# Patient Record
Sex: Female | Born: 1980
Health system: Southern US, Community
[De-identification: ages and names within clinical notes are randomized; demographics above are authoritative.]

## PROBLEM LIST (undated history)

## (undated) HISTORY — PX: TUBAL LIGATION: SHX77

## (undated) HISTORY — PX: ABDOMINAL HYSTERECTOMY: SHX81

## (undated) HISTORY — PX: OVARIAN CYST REMOVAL: SHX89

## (undated) HISTORY — PX: OTHER SURGICAL HISTORY: SHX169

---

## 2016-06-24 ENCOUNTER — Encounter (HOSPITAL_BASED_OUTPATIENT_CLINIC_OR_DEPARTMENT_OTHER): Payer: Self-pay | Admitting: Emergency Medicine

## 2016-06-24 ENCOUNTER — Emergency Department (HOSPITAL_BASED_OUTPATIENT_CLINIC_OR_DEPARTMENT_OTHER)
Admission: EM | Admit: 2016-06-24 | Discharge: 2016-06-24 | Disposition: A | Discharge status: Home / Self Care | Payer: Medicaid Other | Attending: Emergency Medicine | Admitting: Emergency Medicine

## 2016-06-24 DIAGNOSIS — Z4801 Encounter for change or removal of surgical wound dressing: Secondary | ICD-10-CM | POA: Insufficient documentation

## 2016-06-24 DIAGNOSIS — F17200 Nicotine dependence, unspecified, uncomplicated: Secondary | ICD-10-CM | POA: Diagnosis not present

## 2016-06-24 DIAGNOSIS — Z4889 Encounter for other specified surgical aftercare: Secondary | ICD-10-CM

## 2016-06-24 MED ORDER — ACETAMINOPHEN 325 MG PO TABS
650.0000 mg | ORAL_TABLET | Freq: Once | ORAL | Status: AC
  Administered 2016-06-24: 650 mg via ORAL
  Filled 2016-06-24: qty 2

## 2016-06-24 NOTE — ED Provider Notes (Signed)
[facility] Provider Note   CSN: [id number] Arrival date & time: 06/24/16  2144  By signing my name below, I, Linna Darner, attest that this documentation has been prepared under the direction and in the presence of [employer], PA-C. Electronically Signed: Linna Darner, Scribe. 06/24/2016. 10:58 PM.  History   Chief Complaint Chief Complaint  Patient presents with  . Wound Dehiscence    The history is provided by the patient. No language interpreter was used.    HPI Comments: Charlene Bryant is a 36 y.o. female not anticoagulated who presents to the Emergency Department complaining of significant bleeding from a wound site beginning tonight. She had a lesion removed from her left lower leg this morningand started bleeding from the incision site tonight. Pt endorses some pain to the wound site. She denies swelling to the area, numbness/tingling, weakness, fevers, chills, or any other associated symptoms.  History reviewed. No pertinent past medical history.  There are no active problems to display for this patient.   Past Surgical History:  Procedure Laterality Date  . ABDOMINAL HYSTERECTOMY    . OVARIAN CYST REMOVAL    . TUBAL LIGATION      OB History    No data available       Home Medications    Prior to Admission medications   Medication Sig Start Date End Date Taking? Authorizing Provider  BuPROPion HCl (WELLBUTRIN PO) Take by mouth.   Yes Historical Provider, MD  phentermine 37.5 MG capsule Take 37.5 mg by mouth every morning.   Yes Historical Provider, MD  varenicline (CHANTIX) 1 MG tablet Take 1 mg by mouth 2 (two) times daily.   Yes Historical Provider, MD    Family History No family history on file.  Social History Social History  Substance Use Topics  . Smoking status: Current Every Day Smoker  . Smokeless tobacco: Never Used  . Alcohol use No     Allergies   Amoxicillin   Review of Systems Review of Systems  Constitutional:  Negative for chills and fever.  Musculoskeletal: Positive for myalgias.  Skin: Positive for wound.  Neurological: Negative for weakness and numbness.    Physical Exam Updated Vital Signs BP 128/68 (BP Location: Left Arm)   Pulse 66   Temp 98.6 F (37 C) (Oral)   Resp 16   Ht 5\' 9"  (1.753 m)   Wt 184 lb (83.5 kg)   SpO2 97%   BMI 27.17 kg/m   Physical Exam  Constitutional: She appears well-developed and well-nourished. No distress.  HENT:  Head: Normocephalic and atraumatic.  Eyes: Conjunctivae and EOM are normal.  Neck: Neck supple. No tracheal deviation present.  Cardiovascular: Normal rate.   Pulmonary/Chest: Effort normal. No respiratory distress.  Musculoskeletal: Normal range of motion.  Neurological: She is alert.  Patient with 3 cm laceration with intact sutures to the left lower leg. There is some dried blood without active bleeding noted. No evidence of underlying hematoma. Area is generally minimally tender as expected.  Skin: Skin is warm and dry.  Psychiatric: She has a normal mood and affect. Her behavior is normal.  Nursing note and vitals reviewed.   ED Treatments / Results   Procedures Procedures (including critical care time)  DIAGNOSTIC STUDIES: Oxygen Saturation is 97% on RA, normal by my interpretation.    COORDINATION OF CARE: 11:02 PM Discussed treatment plan with pt at bedside and pt agreed to plan.  Medications Ordered in ED Medications - No data to display  Initial Impression / Assessment and Plan / ED Course  I have reviewed the triage vital signs and the nursing notes.  Pertinent labs & imaging results that were available during my care of the patient were reviewed by me and considered in my medical decision making (see chart for details).     Patient seen and examined. Wound appears intact. Attempted to reassure patient. Will place a pressure bandage for patient to leave on for the next 12 hours. She is instructed to call her  primary care physician who did the procedure tomorrow if she continues to have slow oozing from the wound. Patient to return to the emergency department with any significant bleeding or worsening pain.   Vital signs reviewed and are as follows: BP 128/68 (BP Location: Left Arm)   Pulse 66   Temp 98.6 F (37 C) (Oral)   Resp 16   Ht 5\' 9"  (1.753 m)   Wt 83.5 kg   SpO2 97%   BMI 27.17 kg/m     Final Clinical Impressions(s) / ED Diagnoses   Final diagnoses:  Encounter for post surgical wound check   Patient presents with bleeding from wound after minor procedure performed today. Wound edges are well approximated. No expanding hematoma suspected. Patient is not on any anticoagulation. Lower extremity is neurovascularly intact. Patient reassured, treatment as above, follow-up as above.  New Prescriptions New Prescriptions   No medications on file   I personally performed the services described in this documentation, which was scribed in my presence. The recorded information has been reviewed and is accurate.      Renne CriglerJoshua Teondra Newburg, PA-C 06/24/16 [id number]    Shon Batonourtney F Horton, MD 06/25/16 [phone]

## 2016-06-24 NOTE — Discharge Instructions (Signed)
Please read and follow all provided instructions.  Your diagnoses today include:  1. Encounter for post surgical wound check     Tests performed today include:  Vital signs. See below for your results today.   Medications prescribed:   None  Take any prescribed medications only as directed.   Home care instructions:  Follow any educational materials and wound care instructions contained in this packet.   Keep affected area above the level of your heart when possible to minimize swelling. Wash area gently twice a day with warm soapy water. Do not apply alcohol or hydrogen peroxide. Cover the area if it draining or weeping.   Follow-up instructions:  Return instructions:  Return to the Emergency Department if you have:  Fever  Worsening pain  Worsening swelling of the wound  Pus draining from the wound  Redness of the skin that moves away from the wound, especially if it streaks away from the affected area   Any other emergent concerns  Your vital signs today were: BP 128/68 (BP Location: Left Arm)    Pulse 66    Temp 98.6 F (37 C) (Oral)    Resp 16    Ht 5\' 9"  (1.753 m)    Wt 83.5 kg    SpO2 97%    BMI 27.17 kg/m  If your blood pressure (BP) was elevated above 135/85 this visit, please have this repeated by your doctor within one month. --------------

## 2016-06-24 NOTE — ED Triage Notes (Signed)
Pt had lesion removed from leg earlier today and reports wound has opened up and is complaining of pain.

## 2017-01-05 ENCOUNTER — Encounter (HOSPITAL_BASED_OUTPATIENT_CLINIC_OR_DEPARTMENT_OTHER): Payer: Self-pay

## 2017-01-05 ENCOUNTER — Emergency Department (HOSPITAL_BASED_OUTPATIENT_CLINIC_OR_DEPARTMENT_OTHER): Payer: Medicaid Other

## 2017-01-05 ENCOUNTER — Emergency Department (HOSPITAL_BASED_OUTPATIENT_CLINIC_OR_DEPARTMENT_OTHER)
Admission: EM | Admit: 2017-01-05 | Discharge: 2017-01-05 | Disposition: A | Discharge status: Home / Self Care | Payer: Medicaid Other | Attending: Emergency Medicine | Admitting: Emergency Medicine

## 2017-01-05 DIAGNOSIS — Y999 Unspecified external cause status: Secondary | ICD-10-CM | POA: Insufficient documentation

## 2017-01-05 DIAGNOSIS — M25511 Pain in right shoulder: Secondary | ICD-10-CM | POA: Diagnosis not present

## 2017-01-05 DIAGNOSIS — S39012A Strain of muscle, fascia and tendon of lower back, initial encounter: Secondary | ICD-10-CM

## 2017-01-05 DIAGNOSIS — F17210 Nicotine dependence, cigarettes, uncomplicated: Secondary | ICD-10-CM | POA: Insufficient documentation

## 2017-01-05 DIAGNOSIS — S22000A Wedge compression fracture of unspecified thoracic vertebra, initial encounter for closed fracture: Secondary | ICD-10-CM

## 2017-01-05 DIAGNOSIS — Z79899 Other long term (current) drug therapy: Secondary | ICD-10-CM | POA: Insufficient documentation

## 2017-01-05 DIAGNOSIS — Y92410 Unspecified street and highway as the place of occurrence of the external cause: Secondary | ICD-10-CM | POA: Insufficient documentation

## 2017-01-05 DIAGNOSIS — Y9389 Activity, other specified: Secondary | ICD-10-CM | POA: Insufficient documentation

## 2017-01-05 DIAGNOSIS — S3992XA Unspecified injury of lower back, initial encounter: Secondary | ICD-10-CM | POA: Diagnosis present

## 2017-01-05 MED ORDER — HYDROCODONE-ACETAMINOPHEN 5-325 MG PO TABS: 1.0000 | ORAL_TABLET | Freq: Four times a day (QID) | ORAL | 0 refills | Status: DC | PRN

## 2017-01-05 MED ORDER — METHOCARBAMOL 500 MG PO TABS: 500.0000 mg | ORAL_TABLET | Freq: Three times a day (TID) | ORAL | 0 refills | Status: DC | PRN

## 2017-01-05 MED FILL — METHOCARBAMOL 500 MG TABLET: 500 | 3 days supply | Qty: 10 | Fill #0

## 2017-01-05 MED FILL — HYDROCODON-APAP 5-325: 5-325 | 2 days supply | Qty: 8 | Fill #0
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
## 2017-01-07 ENCOUNTER — Emergency Department (HOSPITAL_BASED_OUTPATIENT_CLINIC_OR_DEPARTMENT_OTHER)
Admission: EM | Admit: 2017-01-07 | Discharge: 2017-01-07 | Disposition: A | Discharge status: Home / Self Care | Payer: Medicaid Other | Attending: Emergency Medicine | Admitting: Emergency Medicine

## 2017-01-07 ENCOUNTER — Encounter (HOSPITAL_BASED_OUTPATIENT_CLINIC_OR_DEPARTMENT_OTHER): Payer: Self-pay | Admitting: *Deleted

## 2017-01-07 DIAGNOSIS — M545 Low back pain, unspecified: Secondary | ICD-10-CM

## 2017-01-07 DIAGNOSIS — Z79899 Other long term (current) drug therapy: Secondary | ICD-10-CM | POA: Diagnosis not present

## 2017-01-07 DIAGNOSIS — F17210 Nicotine dependence, cigarettes, uncomplicated: Secondary | ICD-10-CM | POA: Insufficient documentation

## 2017-01-07 MED ORDER — OXYCODONE-ACETAMINOPHEN 5-325 MG PO TABS: 1.0000 | ORAL_TABLET | Freq: Four times a day (QID) | ORAL | 0 refills | Status: DC | PRN

## 2017-01-07 MED ORDER — KETOROLAC TROMETHAMINE 30 MG/ML IJ SOLN
15.0000 mg | Freq: Once | INTRAMUSCULAR | Status: AC
  Administered 2017-01-07: 15 mg via INTRAMUSCULAR
  Filled 2017-01-07: qty 1

## 2017-01-07 MED FILL — OXYCODONE-ACETAMINOPHEN 5-3: 5-325 | 2 days supply | Qty: 15 | Fill #0

## 2017-01-07 NOTE — ED Notes (Signed)
appt made with Neurosurgery Oct 9 th at 1430 Dr Cheri Fowler office

## 2017-01-07 NOTE — ED Notes (Signed)
ED Provider at bedside. 

## 2017-01-07 NOTE — Discharge Instructions (Signed)
Please read instructions below. Talk with your PCP about any new medications.  You can take 1-2 tabs of oxycodone every 4-6 hours as needed for severe pain. Do not take Tylenol, drink alcohol, or drivable taking this medication. You can take Advil/ibuprofen with this medication if needed for added pain relief. Apply ice to your back for 20 minutes at a time. Return to ER if new numbness or tingling in your arms or legs, inability to urinate, inability to hold your bowels, or weakness in your extremities.

## 2017-01-07 NOTE — ED Notes (Signed)
Attempted to call to make pt appointment, office will open at 9 am

## 2017-01-07 NOTE — ED Triage Notes (Signed)
Pt c/o back pain , seen here 2 days ago, has not f/u with Neuro.
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
## 2017-03-14 ENCOUNTER — Other Ambulatory Visit: Payer: Self-pay

## 2017-03-14 ENCOUNTER — Encounter (HOSPITAL_COMMUNITY): Payer: Self-pay

## 2017-03-14 ENCOUNTER — Emergency Department (HOSPITAL_COMMUNITY)
Admission: EM | Admit: 2017-03-14 | Discharge: 2017-03-14 | Disposition: A | Discharge status: Home / Self Care | Payer: Medicaid Other | Attending: Emergency Medicine | Admitting: Emergency Medicine

## 2017-03-14 ENCOUNTER — Emergency Department (HOSPITAL_COMMUNITY): Payer: Medicaid Other

## 2017-03-14 DIAGNOSIS — F17210 Nicotine dependence, cigarettes, uncomplicated: Secondary | ICD-10-CM | POA: Diagnosis not present

## 2017-03-14 DIAGNOSIS — Z79899 Other long term (current) drug therapy: Secondary | ICD-10-CM | POA: Diagnosis not present

## 2017-03-14 DIAGNOSIS — R519 Headache, unspecified: Secondary | ICD-10-CM

## 2017-03-14 DIAGNOSIS — R51 Headache: Secondary | ICD-10-CM | POA: Diagnosis not present

## 2017-03-14 MED ORDER — ACETAMINOPHEN 500 MG PO TABS
1000.0000 mg | ORAL_TABLET | Freq: Once | ORAL | Status: AC
  Administered 2017-03-14: 1000 mg via ORAL
  Filled 2017-03-14: qty 2

## 2017-03-14 MED ORDER — KETOROLAC TROMETHAMINE 30 MG/ML IJ SOLN
30.0000 mg | Freq: Once | INTRAMUSCULAR | Status: AC
  Administered 2017-03-14: 30 mg via INTRAMUSCULAR
  Filled 2017-03-14: qty 1

## 2017-03-14 MED ORDER — PROCHLORPERAZINE MALEATE 5 MG PO TABS
5.0000 mg | ORAL_TABLET | Freq: Once | ORAL | Status: AC
  Administered 2017-03-14: 5 mg via ORAL
  Filled 2017-03-14: qty 1

## 2017-03-14 NOTE — Discharge Instructions (Signed)
Your head CT was negative. Take ibuprofen and Tylenol for head pain. Follow up with primary care doctor as needed.  Return to the ED via develop changes in vision, persistent nausea or vomiting, numbness or weakness to extremities.
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
## 2017-04-14 ENCOUNTER — Encounter (HOSPITAL_BASED_OUTPATIENT_CLINIC_OR_DEPARTMENT_OTHER): Payer: Self-pay

## 2017-04-14 ENCOUNTER — Emergency Department (HOSPITAL_BASED_OUTPATIENT_CLINIC_OR_DEPARTMENT_OTHER): Payer: Medicaid Other

## 2017-04-14 ENCOUNTER — Emergency Department (HOSPITAL_BASED_OUTPATIENT_CLINIC_OR_DEPARTMENT_OTHER)
Admission: EM | Admit: 2017-04-14 | Discharge: 2017-04-14 | Disposition: A | Discharge status: Home / Self Care | Payer: Medicaid Other | Attending: Emergency Medicine | Admitting: Emergency Medicine

## 2017-04-14 ENCOUNTER — Other Ambulatory Visit: Payer: Self-pay

## 2017-04-14 DIAGNOSIS — Z79899 Other long term (current) drug therapy: Secondary | ICD-10-CM | POA: Diagnosis not present

## 2017-04-14 DIAGNOSIS — Y9301 Activity, walking, marching and hiking: Secondary | ICD-10-CM | POA: Insufficient documentation

## 2017-04-14 DIAGNOSIS — Y999 Unspecified external cause status: Secondary | ICD-10-CM | POA: Diagnosis not present

## 2017-04-14 DIAGNOSIS — Y929 Unspecified place or not applicable: Secondary | ICD-10-CM | POA: Insufficient documentation

## 2017-04-14 DIAGNOSIS — S90121A Contusion of right lesser toe(s) without damage to nail, initial encounter: Secondary | ICD-10-CM | POA: Diagnosis not present

## 2017-04-14 DIAGNOSIS — W2203XA Walked into furniture, initial encounter: Secondary | ICD-10-CM | POA: Diagnosis not present

## 2017-04-14 DIAGNOSIS — F17210 Nicotine dependence, cigarettes, uncomplicated: Secondary | ICD-10-CM | POA: Insufficient documentation

## 2017-04-14 DIAGNOSIS — S99921A Unspecified injury of right foot, initial encounter: Secondary | ICD-10-CM | POA: Diagnosis present

## 2017-04-14 MED ORDER — IBUPROFEN 800 MG PO TABS: 800.0000 mg | ORAL_TABLET | Freq: Three times a day (TID) | ORAL | 0 refills | Status: AC | PRN

## 2017-04-14 NOTE — ED Triage Notes (Signed)
Pt reports she hit her right foot, pinky toe on her dresser last night and the pain is shooting up into her foot.

## 2017-04-14 NOTE — Discharge Instructions (Addendum)
You were evaluated in the emergency department after striking your right fourth and fifth toes on the dresser.  Your x-ray showed no obvious fractures.  You should take ibuprofen every 8 hours as needed for pain and continue ice 20 minutes on,  20 minutes off to the affected area.  Weightbearing as tolerated.

## 2017-04-14 NOTE — ED Provider Notes (Signed)
[facility] EMERGENCY DEPARTMENT Provider Note   CSN: [id number] Arrival date & time: 04/14/17  0746     History   Chief Complaint Chief Complaint  Patient presents with  . Toe Pain    right foot, pinky toe    HPI Charlene Bryant is a 37 y.o. female.  HPI  37 year old female with no significant past medical history complaining of acute right fourth and fifth toe pain after striking her foot on her dresser last night.  Pain is moderate severity throbbing in nature worse with ambulation and improved with rest.  There is no associated numbness tingling and patient denies any other injury.  She is tried no medication for any relief.  Pain does radiate up into her midfoot.  Denies any other illness or other medical complaints.  History reviewed. No pertinent past medical history.  There are no active problems to display for this patient.   Past Surgical History:  Procedure Laterality Date  . ABDOMINAL HYSTERECTOMY    . OVARIAN CYST REMOVAL    . TUBAL LIGATION      OB History    No data available       Home Medications    Prior to Admission medications   Medication Sig Start Date End Date Taking? Authorizing Provider  BuPROPion HCl (WELLBUTRIN PO) Take by mouth.    [provider]  HYDROcodone-acetaminophen (NORCO/VICODIN) 5-325 MG tablet Take 1-2 tablets by mouth every 6 (six) hours as needed. 01/05/17   Benjiman CorePickering, Nathan, MD  Loratadine (CLARITIN PO) Take by mouth.    [provider]  methocarbamol (ROBAXIN) 500 MG tablet Take 1 tablet (500 mg total) by mouth every 8 (eight) hours as needed for muscle spasms. 01/05/17   Benjiman CorePickering, Nathan, MD  oxyCODONE-acetaminophen (PERCOCET/ROXICET) 5-325 MG tablet Take 1-2 tablets by mouth every 6 (six) hours as needed for severe pain. 01/07/17   Robinson, [place] N, PA-C  phentermine 37.5 MG capsule Take 37.5 mg by mouth every morning.    [provider]  TRAZODONE HCL PO Take by mouth.    [provider]    Family History No family history on file.  Social History Social History   Tobacco Use  . Smoking status: Current Every Day Smoker    Types: Cigarettes  . Smokeless tobacco: Never Used  Substance Use Topics  . Alcohol use: No  . Drug use: No     Allergies   Amoxicillin   Review of Systems Review of Systems  Constitutional: Negative for fever.  HENT: Negative for sore throat.   Respiratory: Negative for shortness of breath.   Cardiovascular: Negative for chest pain.  Gastrointestinal: Negative for abdominal pain.  Genitourinary: Negative for dysuria.  Skin: Negative for rash.     Physical Exam Updated Vital Signs BP 110/62 (BP Location: Right Arm)   Pulse 80   Temp 97.9 F (36.6 C) (Oral)   Resp 16   Ht 5\' 8"  (1.727 m)   Wt 81.6 kg (180 lb)   SpO2 100%   BMI 27.37 kg/m   Physical Exam  Constitutional: She appears well-developed and well-nourished.  HENT:  Head: Normocephalic and atraumatic.  Eyes: Conjunctivae are normal.  Neck: Neck supple.  Cardiovascular: Normal rate.  Pulmonary/Chest: Effort normal.  Musculoskeletal:       Right knee: Normal.       Right ankle: Normal.       Right foot: There is tenderness (4th and 5th MT and digits). There is no swelling,  no deformity and no laceration.       Feet:  Neurological: She is alert.  Skin: Skin is warm and dry.  Nursing note and vitals reviewed.    ED Treatments / Results  Labs (all labs ordered are listed, but only abnormal results are displayed) Labs Reviewed - No data to display  EKG  EKG Interpretation None       Radiology Dg Foot Complete Right  Result Date: 04/14/2017 CLINICAL DATA:  Hit foot against solid object EXAM: RIGHT FOOT COMPLETE - 3+ VIEW COMPARISON:  None. FINDINGS: Frontal, oblique, and lateral views were obtained. There is no appreciable fracture or dislocation. The joint spaces appear normal. No erosive change. IMPRESSION: No fracture or dislocation.   No evident arthropathy. Electronically Signed   By: Bretta Bang III M.D.   On: 04/14/2017 08:14    Procedures Procedures (including critical care time)  Medications Ordered in ED Medications - No data to display   Initial Impression / Assessment and Plan / ED Course  I have reviewed the triage vital signs and the nursing notes.  Pertinent labs & imaging results that were available during my care of the patient were reviewed by me and considered in my medical decision making (see chart for details).     Patient with blunt trauma to her right foot last evening.  Exam is benign.  And there is no open wounds.  We are getting an x-ray to see if there is a fracture but I discussed with the patient that the management would be symptomatic relief with ice elevation and time.  Final Clinical Impressions(s) / ED Diagnoses   Final diagnoses:  Contusion of fifth toe of right foot, initial encounter    ED Discharge Orders    None       Terrilee Files, MD 04/14/17 1551

## 2018-12-06 ENCOUNTER — Ambulatory Visit: Payer: Medicaid Other | Attending: Urology | Admitting: Physical Therapy

## 2019-03-06 ENCOUNTER — Emergency Department (HOSPITAL_BASED_OUTPATIENT_CLINIC_OR_DEPARTMENT_OTHER)
Admission: EM | Admit: 2019-03-06 | Discharge: 2019-03-06 | Disposition: A | Discharge status: Home / Self Care | Payer: Medicaid Other | Attending: Emergency Medicine | Admitting: Emergency Medicine

## 2019-03-06 ENCOUNTER — Other Ambulatory Visit: Payer: Self-pay

## 2019-03-06 ENCOUNTER — Encounter (HOSPITAL_BASED_OUTPATIENT_CLINIC_OR_DEPARTMENT_OTHER): Payer: Self-pay

## 2019-03-06 DIAGNOSIS — Z79899 Other long term (current) drug therapy: Secondary | ICD-10-CM | POA: Insufficient documentation

## 2019-03-06 DIAGNOSIS — F17210 Nicotine dependence, cigarettes, uncomplicated: Secondary | ICD-10-CM | POA: Diagnosis not present

## 2019-03-06 DIAGNOSIS — Y999 Unspecified external cause status: Secondary | ICD-10-CM | POA: Insufficient documentation

## 2019-03-06 DIAGNOSIS — Y9289 Other specified places as the place of occurrence of the external cause: Secondary | ICD-10-CM | POA: Insufficient documentation

## 2019-03-06 DIAGNOSIS — X500XXA Overexertion from strenuous movement or load, initial encounter: Secondary | ICD-10-CM | POA: Insufficient documentation

## 2019-03-06 DIAGNOSIS — S39012A Strain of muscle, fascia and tendon of lower back, initial encounter: Secondary | ICD-10-CM | POA: Diagnosis not present

## 2019-03-06 DIAGNOSIS — Y9389 Activity, other specified: Secondary | ICD-10-CM | POA: Insufficient documentation

## 2019-03-06 DIAGNOSIS — M5489 Other dorsalgia: Secondary | ICD-10-CM | POA: Diagnosis present

## 2019-03-06 DIAGNOSIS — M62830 Muscle spasm of back: Secondary | ICD-10-CM | POA: Insufficient documentation

## 2019-03-06 MED ORDER — KETOROLAC TROMETHAMINE 30 MG/ML IJ SOLN
30.0000 mg | Freq: Once | INTRAMUSCULAR | Status: AC
  Administered 2019-03-06: 30 mg via INTRAMUSCULAR
  Filled 2019-03-06: qty 1

## 2019-03-06 MED ORDER — NAPROXEN 500 MG PO TABS: 500.0000 mg | ORAL_TABLET | Freq: Two times a day (BID) | ORAL | 0 refills | Status: AC

## 2019-03-06 MED ORDER — METHOCARBAMOL 750 MG PO TABS
750.0000 mg | ORAL_TABLET | Freq: Every evening | ORAL | 0 refills | Status: AC | PRN
Start: 2019-03-06 — End: 2019-03-11

## 2019-03-06 NOTE — ED Provider Notes (Signed)
[place] EMERGENCY DEPARTMENT Provider Note   CSN: [id number] Arrival date & time: 03/06/19  1437     History   Chief Complaint Chief Complaint  Patient presents with  . Back Pain    HPI Charlene Bryant is a 38 y.o. female.     HPI   Pt is a 38 y/o female who presents to the ED today for eval of back pain. Pain started yesterday around 4-5 pm. Pain located to the bilat lower back. Pain is constant and has worsened since onset. Pain rated 10/10. She has tried bayer otc without relief. States pain feels like a pulled muscle and is worse when she moves. She states pain started after moving a lot of heavy objects yesterday. Denies GI/GU complaints.   Pt denies any numbness/tingling/weakness to the BLE. Denies saddle anesthesia. Denies loss of control of bowels or bladder. No urinary retention. No fevers. Denies a h/o IVDU. Denies a h/o CA.Marland Kitchen  No past medical history on file.  There are no active problems to display for this patient.   Past Surgical History:  Procedure Laterality Date  . ABDOMINAL HYSTERECTOMY    . OVARIAN CYST REMOVAL    . TUBAL LIGATION       OB History   No obstetric history on file.      Home Medications    Prior to Admission medications   Medication Sig Start Date End Date Taking? Authorizing Provider  BuPROPion HCl (WELLBUTRIN PO) Take by mouth.    [provider]  HYDROcodone-acetaminophen (NORCO/VICODIN) 5-325 MG tablet Take 1-2 tablets by mouth every 6 (six) hours as needed. 01/05/17   Davonna Belling, MD  ibuprofen (ADVIL,MOTRIN) 800 MG tablet Take 1 tablet (800 mg total) by mouth every 8 (eight) hours as needed. 04/14/17   Hayden Rasmussen, MD  Loratadine (CLARITIN PO) Take by mouth.    [provider]  methocarbamol (ROBAXIN) 750 MG tablet Take 1 tablet (750 mg total) by mouth at bedtime as needed for up to 5 days for muscle spasms. 03/06/19 03/11/19  Qasim Diveley S, PA-C  naproxen (NAPROSYN) 500 MG tablet  Take 1 tablet (500 mg total) by mouth 2 (two) times daily for 5 days. 03/06/19 03/11/19  Anothony Bursch S, PA-C  oxyCODONE-acetaminophen (PERCOCET/ROXICET) 5-325 MG tablet Take 1-2 tablets by mouth every 6 (six) hours as needed for severe pain. 01/07/17   Robinson, [place] N, PA-C  phentermine 37.5 MG capsule Take 37.5 mg by mouth every morning.    [provider]  ranitidine (ZANTAC) 75 MG tablet Take 75 mg by mouth 2 (two) times daily.    [provider]  TRAZODONE HCL PO Take by mouth.    [provider]    Family History No family history on file.  Social History Social History   Tobacco Use  . Smoking status: Current Every Day Smoker    Types: Cigarettes  . Smokeless tobacco: Never Used  Substance Use Topics  . Alcohol use: No  . Drug use: No     Allergies   Amoxicillin   Review of Systems Review of Systems  Constitutional: Negative for fever.  Gastrointestinal: Negative for abdominal pain, constipation, diarrhea, nausea and vomiting.  Genitourinary: Negative for dysuria, flank pain, frequency, hematuria, pelvic pain and urgency.  Musculoskeletal: Positive for back pain.  Neurological: Negative for weakness and numbness.     Physical Exam Updated Vital Signs BP 114/68   Pulse 62   Temp 99 F (37.2 C) (Oral)  Resp 17   Ht 5\' 9"  (1.753 m)   SpO2 99%   BMI 26.58 kg/m   Physical Exam Vitals signs and nursing note reviewed.  Constitutional:      General: She is not in acute distress.    Appearance: She is well-developed.  HENT:     Head: Normocephalic and atraumatic.  Eyes:     Conjunctiva/sclera: Conjunctivae normal.  Neck:     Musculoskeletal: Neck supple.  Cardiovascular:     Rate and Rhythm: Normal rate.  Pulmonary:     Effort: Pulmonary effort is normal.  Musculoskeletal: Normal range of motion.     Comments: TTP to the bilat lumbar paraspinous muscles with muscle spasm. 5/5 strength to ble with normal rom and sensation.  Ambulatory with steady gait.   Skin:    General: Skin is warm and dry.  Neurological:     Mental Status: She is alert.      ED Treatments / Results  Labs (all labs ordered are listed, but only abnormal results are displayed) Labs Reviewed - No data to display  EKG None  Radiology No results found.  Procedures Procedures (including critical care time)  Medications Ordered in ED Medications  ketorolac (TORADOL) 30 MG/ML injection 30 mg (has no administration in time range)     Initial Impression / Assessment and Plan / ED Course  I have reviewed the triage vital signs and the nursing notes.  Pertinent labs & imaging results that were available during my care of the patient were reviewed by me and considered in my medical decision making (see chart for details).  Final Clinical Impressions(s) / ED Diagnoses   Final diagnoses:  Strain of lumbar region, initial encounter   Patient with back pain.  Like muscle strain from heavy lifting yesterday. No neurological deficits and normal neuro exam.  Patient can walk but states is painful.  No loss of bowel or bladder control.  No concern for cauda equina.  No fever, night sweats, weight loss, h/o cancer, IVDU.  RICE protocol and pain medicine indicated and discussed with patient.   ED Discharge Orders         Ordered    naproxen (NAPROSYN) 500 MG tablet  2 times daily     03/06/19 1523    methocarbamol (ROBAXIN) 750 MG tablet  At bedtime PRN     03/06/19 1523           Annisha Baar S, PA-C 03/06/19 1525    03/08/19, MD 03/06/19 [phone]

## 2019-03-06 NOTE — Discharge Instructions (Addendum)
You may alternate taking Tylenol and Naproxen as needed for pain control. You may take Naproxen twice daily as directed on your discharge paperwork and you may take  [phone] mg of Tylenol every 6 hours. Do not exceed 4000 mg of Tylenol daily as this can lead to liver damage. Also, make sure to take Naproxen with meals as it can cause an upset stomach. Do not take other NSAIDs while taking Naproxen such as (Aleve, Ibuprofen, Aspirin, Celebrex, etc) and do not take more than the prescribed dose as this can lead to ulcers and bleeding in your GI tract. You may use warm and cold compresses to help with your symptoms.   You were given a prescription for Robaxin which is a muscle relaxer.  You should not drive, work, or operate machinery while taking this medication as it can make you very drowsy.    Please follow up with your primary doctor within the next 7-10 days for re-evaluation and further treatment of your symptoms.   Return to the emergency department immediately if you experience any back pain associated with fevers, loss of control of your bowels/bladder, weakness/numbness to your legs, numbness to your groin area, inability to walk, or inability to urinate.

## 2019-03-06 NOTE — ED Triage Notes (Signed)
Pt reports pulling a muscle in her lower back yesterday- pain worse today. Pt ambulatory with steady gait. Denies urinary symptoms.

## 2019-06-20 ENCOUNTER — Emergency Department (HOSPITAL_BASED_OUTPATIENT_CLINIC_OR_DEPARTMENT_OTHER)
Admission: EM | Admit: 2019-06-20 | Discharge: 2019-06-21 | Disposition: A | Discharge status: Home / Self Care | Payer: No Typology Code available for payment source | Attending: Emergency Medicine | Admitting: Emergency Medicine

## 2019-06-20 ENCOUNTER — Emergency Department (HOSPITAL_BASED_OUTPATIENT_CLINIC_OR_DEPARTMENT_OTHER): Payer: No Typology Code available for payment source

## 2019-06-20 ENCOUNTER — Other Ambulatory Visit: Payer: Self-pay

## 2019-06-20 ENCOUNTER — Encounter (HOSPITAL_BASED_OUTPATIENT_CLINIC_OR_DEPARTMENT_OTHER): Payer: Self-pay | Admitting: *Deleted

## 2019-06-20 DIAGNOSIS — Z880 Allergy status to penicillin: Secondary | ICD-10-CM | POA: Diagnosis not present

## 2019-06-20 DIAGNOSIS — R079 Chest pain, unspecified: Secondary | ICD-10-CM

## 2019-06-20 DIAGNOSIS — Z79899 Other long term (current) drug therapy: Secondary | ICD-10-CM | POA: Diagnosis not present

## 2019-06-20 DIAGNOSIS — F17210 Nicotine dependence, cigarettes, uncomplicated: Secondary | ICD-10-CM | POA: Insufficient documentation

## 2019-06-20 DIAGNOSIS — R0789 Other chest pain: Secondary | ICD-10-CM | POA: Insufficient documentation

## 2019-06-20 LAB — CBC WITH DIFFERENTIAL/PLATELET
Abs Immature Granulocytes: 0.01 10*3/uL (ref 0.00–0.07)
Basophils Absolute: 0.1 10*3/uL (ref 0.0–0.1)
Basophils Relative: 1 %
Eosinophils Absolute: 0.1 10*3/uL (ref 0.0–0.5)
Eosinophils Relative: 3 %
HCT: 38.8 % (ref 36.0–46.0)
Hemoglobin: 13.2 g/dL (ref 12.0–15.0)
Immature Granulocytes: 0 %
Lymphocytes Relative: 56 %
Lymphs Abs: 3.2 10*3/uL (ref 0.7–4.0)
MCH: 31.9 pg (ref 26.0–34.0)
MCHC: 34 g/dL (ref 30.0–36.0)
MCV: 93.7 fL (ref 80.0–100.0)
Monocytes Absolute: 0.4 10*3/uL (ref 0.1–1.0)
Monocytes Relative: 6 %
Neutro Abs: 2 10*3/uL (ref 1.7–7.7)
Neutrophils Relative %: 34 %
Platelets: 176 10*3/uL (ref 150–400)
RBC: 4.14 MIL/uL (ref 3.87–5.11)
RDW: 11.8 % (ref 11.5–15.5)
WBC: 5.7 10*3/uL (ref 4.0–10.5)
nRBC: 0 % (ref 0.0–0.2)

## 2019-06-20 LAB — HEPATIC FUNCTION PANEL
ALT: 15 U/L (ref 0–44)
AST: 17 U/L (ref 15–41)
Albumin: 3.9 g/dL (ref 3.5–5.0)
Alkaline Phosphatase: 50 U/L (ref 38–126)
Bilirubin, Direct: 0.1 mg/dL (ref 0.0–0.2)
Indirect Bilirubin: 0.3 mg/dL (ref 0.3–0.9)
Total Bilirubin: 0.4 mg/dL (ref 0.3–1.2)
Total Protein: 6.8 g/dL (ref 6.5–8.1)

## 2019-06-20 LAB — BASIC METABOLIC PANEL
Anion gap: 7 (ref 5–15)
BUN: 22 mg/dL — ABNORMAL HIGH (ref 6–20)
CO2: 25 mmol/L (ref 22–32)
Calcium: 9.4 mg/dL (ref 8.9–10.3)
Chloride: 106 mmol/L (ref 98–111)
Creatinine, Ser: 0.82 mg/dL (ref 0.44–1.00)
GFR calc Af Amer: >60 mL/min (ref >60)
GFR calc non Af Amer: >60 mL/min (ref >60)
Glucose, Bld: 90 mg/dL (ref 70–99)
Potassium: 3.6 mmol/L (ref 3.5–5.1)
Sodium: 138 mmol/L (ref 135–145)

## 2019-06-20 LAB — TROPONIN I (HIGH SENSITIVITY): Troponin I (High Sensitivity): <2 ng/L (ref <18)

## 2019-06-20 LAB — D-DIMER, QUANTITATIVE (NOT AT ARMC): D-Dimer, Quant: 0.31 ug/mL-FEU (ref 0.00–0.50)

## 2019-06-20 MED ORDER — CYCLOBENZAPRINE HCL 10 MG PO TABS
10.0000 mg | ORAL_TABLET | Freq: Once | ORAL | Status: AC
Start: 2019-06-20 — End: 2019-06-20
  Administered 2019-06-20: 23:00:00 10 mg via ORAL
  Filled 2019-06-20: qty 1

## 2019-06-20 NOTE — ED Provider Notes (Signed)
[facility] EMERGENCY DEPARTMENT Provider Note   CSN: [id number] Arrival date & time: 06/20/19  2212     History Chief Complaint  Patient presents with  . Chest Pain    Charlene Bryant is a 39 y.o. female.  The history is provided by the patient and medical records. No language interpreter was used.  Chest Pain Pain location:  R chest and R lateral chest Pain quality: crushing and pressure   Pain radiates to:  R shoulder Pain severity:  Severe Onset quality:  Sudden Duration:  3 hours Timing:  Constant Progression:  Unchanged Chronicity:  Recurrent Relieved by:  Nothing Worsened by:  Deep breathing and movement Ineffective treatments:  None tried Associated symptoms: shortness of breath   Associated symptoms: no abdominal pain, no back pain, no cough, no diaphoresis, no dizziness, no fatigue, no fever, no lower extremity edema, no nausea, no palpitations, no vomiting and no weakness   Risk factors: smoking   Risk factors: no aortic disease, no hypertension, not female and no prior DVT/PE        History reviewed. No pertinent past medical history.  There are no problems to display for this patient.   Past Surgical History:  Procedure Laterality Date  . ABDOMINAL HYSTERECTOMY    . OVARIAN CYST REMOVAL    . TUBAL LIGATION       OB History   No obstetric history on file.     History reviewed. No pertinent family history.  Social History   Tobacco Use  . Smoking status: Current Every Day Smoker    Types: Cigarettes  . Smokeless tobacco: Never Used  Substance Use Topics  . Alcohol use: No  . Drug use: No    Home Medications Prior to Admission medications   Medication Sig Start Date End Date Taking? Authorizing Provider  BuPROPion HCl (WELLBUTRIN PO) Take by mouth.    [provider]  HYDROcodone-acetaminophen (NORCO/VICODIN) 5-325 MG tablet Take 1-2 tablets by mouth every 6 (six) hours as needed. 01/05/17   Davonna Belling, MD    ibuprofen (ADVIL,MOTRIN) 800 MG tablet Take 1 tablet (800 mg total) by mouth every 8 (eight) hours as needed. 04/14/17   Hayden Rasmussen, MD  Loratadine (CLARITIN PO) Take by mouth.    [provider]  oxyCODONE-acetaminophen (PERCOCET/ROXICET) 5-325 MG tablet Take 1-2 tablets by mouth every 6 (six) hours as needed for severe pain. 01/07/17   Robinson, [place] N, PA-C  phentermine 37.5 MG capsule Take 37.5 mg by mouth every morning.    [provider]  ranitidine (ZANTAC) 75 MG tablet Take 75 mg by mouth 2 (two) times daily.    [provider]  TRAZODONE HCL PO Take by mouth.    [provider]    Allergies    Amoxicillin  Review of Systems   Review of Systems  Constitutional: Negative for chills, diaphoresis, fatigue and fever.  HENT: Negative for congestion.   Respiratory: Positive for shortness of breath. Negative for cough, choking, chest tightness and wheezing.   Cardiovascular: Positive for chest pain. Negative for palpitations and leg swelling.  Gastrointestinal: Negative for abdominal pain, constipation, diarrhea, nausea and vomiting.  Genitourinary: Negative for dysuria and flank pain.  Musculoskeletal: Negative for back pain, neck pain and neck stiffness.  Skin: Negative for rash and wound.  Neurological: Negative for dizziness, weakness and light-headedness.  Psychiatric/Behavioral: Negative for agitation.  All other systems reviewed and are negative.   Physical Exam Updated Vital Signs BP 108/64 (BP  Location: Left Arm)   Pulse 66   Temp 98.1 F (36.7 C) (Oral)   Resp 18   Ht 5\' 7"  (1.702 m)   Wt 89.4 kg   SpO2 99%   BMI 30.85 kg/m   Physical Exam Vitals and nursing note reviewed.  Constitutional:      General: She is not in acute distress.    Appearance: She is well-developed. She is not ill-appearing, toxic-appearing or diaphoretic.  HENT:     Head: Normocephalic and atraumatic.     Right Ear: External ear normal.      Left Ear: External ear normal.     Nose: Nose normal.     Mouth/Throat:     Pharynx: No oropharyngeal exudate.  Eyes:     Conjunctiva/sclera: Conjunctivae normal.     Pupils: Pupils are equal, round, and reactive to light.  Cardiovascular:     Rate and Rhythm: Normal rate and regular rhythm.     Heart sounds: Normal heart sounds.  Pulmonary:     Effort: Pulmonary effort is normal. No tachypnea or respiratory distress.     Breath sounds: Normal breath sounds. No stridor.  Chest:     Chest wall: Tenderness present.    Abdominal:     General: There is no distension.     Tenderness: There is no abdominal tenderness. There is no rebound.  Musculoskeletal:     Cervical back: Normal range of motion and neck supple.     Left lower leg: No tenderness. No edema.  Skin:    General: Skin is warm.     Capillary Refill: Capillary refill takes less than 2 seconds.     Findings: No erythema or rash.  Neurological:     General: No focal deficit present.     Mental Status: She is alert and oriented to person, place, and time.     Motor: No abnormal muscle tone.     Coordination: Coordination normal.     Deep Tendon Reflexes: Reflexes are normal and symmetric.  Psychiatric:        Mood and Affect: Mood normal.     ED Results / Procedures / Treatments   Labs (all labs ordered are listed, but only abnormal results are displayed) Labs Reviewed  BASIC METABOLIC PANEL - Abnormal; Notable for the following components:      Result Value   BUN 22 (*)    All other components within normal limits  CBC WITH DIFFERENTIAL/PLATELET  D-DIMER, QUANTITATIVE (NOT AT [facility])  HEPATIC FUNCTION PANEL  TROPONIN I (HIGH SENSITIVITY)  TROPONIN I (HIGH SENSITIVITY)    EKG EKG Interpretation  Date/Time:  Monday June 20 2019 22:15:29 EDT Ventricular Rate:  65 PR Interval:  164 QRS Duration: 90 QT Interval:  388 QTC Calculation: 403 R Axis:   66 Text Interpretation: Normal sinus rhythm Normal ECG  Confirmed by 07-14-2002 (Marianna Fuss) on 06/20/2019 10:31:23 PM   Radiology DG Chest 2 View  Result Date: 06/20/2019 CLINICAL DATA:  Chest pain EXAM: CHEST - 2 VIEW COMPARISON:  03/04/2011 FINDINGS: Mild bronchitic changes. No consolidation or pleural effusion. Cardiomediastinal silhouette within normal limits. No pneumothorax IMPRESSION: No active cardiopulmonary disease.  Bronchitic changes Electronically Signed   By: 03/06/2011 M.D.   On: 06/20/2019 22:40    Procedures Procedures (including critical care time)  Medications Ordered in ED Medications  cyclobenzaprine (FLEXERIL) tablet 10 mg (10 mg Oral Given 06/20/19 2314)    ED Course  I have reviewed the triage vital signs  and the nursing notes.  Pertinent labs & imaging results that were available during my care of the patient were reviewed by me and considered in my medical decision making (see chart for details).    MDM Rules/Calculators/A&P                      Charlene Bryant is a 39 y.o. female with no significant past medical history who presents with right-sided chest pain for the last 3 hours.  She reports that she had sudden onset of right-sided pleuritic chest discomfort that is worse with deep breathing.  She reports the pain radiates towards her right shoulder.  She reports she had this pain several years ago and had to stay in the hospital for it but does not member what they found.  She reports no significant nausea, vomiting, urinary symptoms or GI symptoms.  She does report some shortness of breath with it.  She denies history of DVT or PE but does report a family history of heart disease.  She does not know other significant family history.  She reports no significant fevers, chills, ingestion, or cough.  She denies trauma.  She does report some mild headache that is chronic.  She has not taken any medicine for her symptoms.  She describes a 10 out of 10.  On exam, lungs are clear and patient does have tenderness on  her right chest reproducing her discomfort.  Good pulses, strength, and sensation in upper extremities.  Abdomen nontender.  Legs nontender nonedematous.  Good pulses.  Patient alert and oriented.  EKG shows no STEMI.  Clinically I am most concerned about a musculoskeletal chest wall discomfort.  However, given the pleuritic nature of the symptoms and her report that it was sudden onset, we will get labs including a D-dimer and troponins.  Will get chest x-ray and monitor on telemetry.  As I am concerned most about muscle pains, will give muscle relaxant to see if this helps with discomfort while we work her up.  Chest x-ray returned unremarkable aside from bronchitic changes.  Anticipate discharge if work-up returns reassuring.  Patient's D-dimer was negative.  Initial troponin was negative.  CBC and CMP overall reassuring.    Patient was informed of these findings and how her symptoms are most likely musculoskeletal chest wall in nature.  Patient did not want to wait for second troponin and was feeling better after the Flexeril.  She would like to be discharged and can follow-up with her PCP.  She understood extremely strict return precautions.  She had no other questions or concerns and was discharged in good condition after reassuring work-up.    Final Clinical Impression(s) / ED Diagnoses Final diagnoses:  Nonspecific chest pain  Chest wall pain    Rx / DC Orders ED Discharge Orders         Ordered    cyclobenzaprine (FLEXERIL) 10 MG tablet  2 times daily PRN     06/21/19 0109          Clinical Impression: 1. Nonspecific chest pain   2. Chest wall pain     Disposition: Discharge  Condition: Good  I have discussed the results, Dx and Tx plan with the pt(& family if present). He/she/they expressed understanding and agree(s) with the plan. Discharge instructions discussed at great length. Strict return precautions discussed and pt &/or family have verbalized understanding  of the instructions. No further questions at time of discharge.    New Prescriptions  CYCLOBENZAPRINE (FLEXERIL) 10 MG TABLET    Take 1 tablet (10 mg total) by mouth 2 (two) times daily as needed for muscle spasms.    Follow Up: [facility] [address] [place] [address] [phone] Schedule an appointment as soon as possible for a visit    [facility] [place] [facility]GENCY DEPARTMENT [address] [id number] [id number] [place] [address] [phone]       Peregrine Nolt, Canary Brim, MD 06/21/19 [phone]

## 2019-06-20 NOTE — ED Triage Notes (Signed)
Right upper chest tenderness without radiation x 3 hours. Denies N/v/D. Reports increased pain with right arm movement, palpation and deep inspiration. No acute distress noted. Pt reports that she was at work when the pain started.

## 2019-06-21 MED ORDER — CYCLOBENZAPRINE HCL 10 MG PO TABS: 10.0000 mg | ORAL_TABLET | Freq: Two times a day (BID) | ORAL | 0 refills | Status: DC | PRN

## 2019-06-21 NOTE — Discharge Instructions (Signed)
Your history and exam today are consistent with musculoskeletal chest wall discomfort on the right chest. The other testing was reassuring including negative heart enzyme, negative blood clot test, and reassuring chest x-ray. Your vital signs were reassuring while we monitored you. Please rest and stay hydrated tomorrow and use the muscle relaxant to help with the discomfort. If any symptoms change or worsen, please return to the nearest emergency department.

## 2019-06-21 NOTE — ED Notes (Signed)
2nd troponin lab draw not needed per Tegeler MD.

## 2019-07-14 ENCOUNTER — Other Ambulatory Visit: Payer: Self-pay

## 2019-07-14 ENCOUNTER — Encounter (HOSPITAL_BASED_OUTPATIENT_CLINIC_OR_DEPARTMENT_OTHER): Payer: Self-pay | Admitting: *Deleted

## 2019-07-14 DIAGNOSIS — B349 Viral infection, unspecified: Secondary | ICD-10-CM | POA: Insufficient documentation

## 2019-07-14 DIAGNOSIS — F17210 Nicotine dependence, cigarettes, uncomplicated: Secondary | ICD-10-CM | POA: Insufficient documentation

## 2019-07-14 DIAGNOSIS — M2550 Pain in unspecified joint: Secondary | ICD-10-CM | POA: Insufficient documentation

## 2019-07-14 DIAGNOSIS — R0981 Nasal congestion: Secondary | ICD-10-CM | POA: Diagnosis present

## 2019-07-14 DIAGNOSIS — R05 Cough: Secondary | ICD-10-CM | POA: Insufficient documentation

## 2019-07-14 DIAGNOSIS — Z20822 Contact with and (suspected) exposure to covid-19: Secondary | ICD-10-CM | POA: Insufficient documentation

## 2019-07-14 DIAGNOSIS — J3489 Other specified disorders of nose and nasal sinuses: Secondary | ICD-10-CM | POA: Diagnosis not present

## 2019-07-14 DIAGNOSIS — Z79899 Other long term (current) drug therapy: Secondary | ICD-10-CM | POA: Insufficient documentation

## 2019-07-14 NOTE — ED Triage Notes (Signed)
C/o cough congestion and  fever  X 4 days, requesting covid test

## 2019-07-15 ENCOUNTER — Emergency Department (HOSPITAL_BASED_OUTPATIENT_CLINIC_OR_DEPARTMENT_OTHER)
Admission: EM | Admit: 2019-07-15 | Discharge: 2019-07-15 | Disposition: A | Discharge status: Home / Self Care | Payer: PRIVATE HEALTH INSURANCE | Attending: Emergency Medicine | Admitting: Emergency Medicine

## 2019-07-15 DIAGNOSIS — B349 Viral infection, unspecified: Secondary | ICD-10-CM

## 2019-07-15 LAB — SARS CORONAVIRUS 2 (TAT 6-24 HRS): SARS Coronavirus 2: NEGATIVE

## 2019-07-15 MED ORDER — DEXAMETHASONE 6 MG PO TABS
10.0000 mg | ORAL_TABLET | Freq: Once | ORAL | Status: AC
  Administered 2019-07-15: 02:00:00 10 mg via ORAL
  Filled 2019-07-15: qty 1

## 2019-07-15 NOTE — ED Provider Notes (Signed)
[facility] EMERGENCY DEPARTMENT Provider Note   CSN: [id number] Arrival date & time: 07/14/19  2316     History Chief Complaint  Patient presents with  . URI    Charlene Bryant is a 39 y.o. female.  The history is provided by the patient.  URI Presenting symptoms: congestion, cough, rhinorrhea and sore throat   Severity:  Mild Onset quality:  Gradual Duration:  4 days Timing:  Intermittent Progression:  Worsening Chronicity:  New Relieved by:  OTC medications Associated symptoms: arthralgias and sinus pain   Associated symptoms: no neck pain and no wheezing        History reviewed. No pertinent past medical history.  There are no problems to display for this patient.   Past Surgical History:  Procedure Laterality Date  . ABDOMINAL HYSTERECTOMY    . OVARIAN CYST REMOVAL    . TUBAL LIGATION       OB History   No obstetric history on file.     History reviewed. No pertinent family history.  Social History   Tobacco Use  . Smoking status: Current Every Day Smoker    Types: Cigarettes  . Smokeless tobacco: Never Used  Substance Use Topics  . Alcohol use: No  . Drug use: No    Home Medications Prior to Admission medications   Medication Sig Start Date End Date Taking? Authorizing Provider  BuPROPion HCl (WELLBUTRIN PO) Take by mouth.    [provider]  cyclobenzaprine (FLEXERIL) 10 MG tablet Take 1 tablet (10 mg total) by mouth 2 (two) times daily as needed for muscle spasms. 06/21/19   Tegeler, Gwenyth Allegra, MD  HYDROcodone-acetaminophen (NORCO/VICODIN) 5-325 MG tablet Take 1-2 tablets by mouth every 6 (six) hours as needed. 01/05/17   Davonna Belling, MD  ibuprofen (ADVIL,MOTRIN) 800 MG tablet Take 1 tablet (800 mg total) by mouth every 8 (eight) hours as needed. 04/14/17   Hayden Rasmussen, MD  Loratadine (CLARITIN PO) Take by mouth.    [provider]  oxyCODONE-acetaminophen (PERCOCET/ROXICET) 5-325 MG tablet Take 1-2  tablets by mouth every 6 (six) hours as needed for severe pain. 01/07/17   Robinson, [place] N, PA-C  phentermine 37.5 MG capsule Take 37.5 mg by mouth every morning.    [provider]  ranitidine (ZANTAC) 75 MG tablet Take 75 mg by mouth 2 (two) times daily.    [provider]  TRAZODONE HCL PO Take by mouth.    [provider]    Allergies    Amoxicillin  Review of Systems   Review of Systems  HENT: Positive for congestion, rhinorrhea, sinus pain and sore throat.   Respiratory: Positive for cough. Negative for wheezing.   Musculoskeletal: Positive for arthralgias. Negative for neck pain.  All other systems reviewed and are negative.   Physical Exam Updated Vital Signs BP 135/74   Pulse 73   Temp 98.1 F (36.7 C) (Oral)   Resp 18   Ht 5\' 7"  (1.702 m)   Wt 88.9 kg   SpO2 100%   BMI 30.70 kg/m   Physical Exam Vitals and nursing note reviewed.  Constitutional:      Appearance: She is well-developed.  HENT:     Head: Normocephalic and atraumatic.     Mouth/Throat:     Mouth: Mucous membranes are dry.     Pharynx: Oropharynx is clear.  Eyes:     Conjunctiva/sclera: Conjunctivae normal.     Pupils: Pupils are equal, round, and reactive to light.  Cardiovascular:     Rate and Rhythm: Normal rate and regular rhythm.     Heart sounds: No murmur.  Pulmonary:     Effort: No respiratory distress.     Breath sounds: No stridor.  Abdominal:     General: There is no distension.  Musculoskeletal:        General: No swelling, tenderness or deformity. Normal range of motion.     Cervical back: Normal range of motion.  Skin:    General: Skin is warm and dry.  Neurological:     General: No focal deficit present.     Mental Status: She is alert.     ED Results / Procedures / Treatments   Labs (all labs ordered are listed, but only abnormal results are displayed) Labs Reviewed  SARS CORONAVIRUS 2 (TAT 6-24 HRS)    EKG None  Radiology No  results found.  Procedures Procedures (including critical care time)  Medications Ordered in ED Medications  dexamethasone (DECADRON) tablet 10 mg (has no administration in time range)    ED Course  I have reviewed the triage vital signs and the nursing notes.  Pertinent labs & imaging results that were available during my care of the patient were reviewed by me and considered in my medical decision making (see chart for details).    MDM Rules/Calculators/A&P   Glenford Peers of some sort but essentially is here for a covid test. Ordered. Steroids for pharyngitis.  Final Clinical Impression(s) / ED Diagnoses Final diagnoses:  Viral syndrome    Rx / DC Orders ED Discharge Orders    None       Brandt Chaney, Barbara Cower, MD 07/15/19 [phone]

## 2019-10-12 ENCOUNTER — Other Ambulatory Visit: Payer: Self-pay

## 2019-10-12 ENCOUNTER — Encounter (HOSPITAL_BASED_OUTPATIENT_CLINIC_OR_DEPARTMENT_OTHER): Payer: Self-pay

## 2019-10-12 ENCOUNTER — Emergency Department (HOSPITAL_BASED_OUTPATIENT_CLINIC_OR_DEPARTMENT_OTHER)
Admission: EM | Admit: 2019-10-12 | Discharge: 2019-10-12 | Disposition: A | Discharge status: Home / Self Care | Payer: Medicaid Other | Attending: Emergency Medicine | Admitting: Emergency Medicine

## 2019-10-12 ENCOUNTER — Emergency Department (HOSPITAL_BASED_OUTPATIENT_CLINIC_OR_DEPARTMENT_OTHER): Payer: Medicaid Other

## 2019-10-12 DIAGNOSIS — Z20822 Contact with and (suspected) exposure to covid-19: Secondary | ICD-10-CM | POA: Insufficient documentation

## 2019-10-12 DIAGNOSIS — F17210 Nicotine dependence, cigarettes, uncomplicated: Secondary | ICD-10-CM | POA: Diagnosis not present

## 2019-10-12 DIAGNOSIS — J069 Acute upper respiratory infection, unspecified: Secondary | ICD-10-CM | POA: Diagnosis not present

## 2019-10-12 DIAGNOSIS — R05 Cough: Secondary | ICD-10-CM | POA: Diagnosis present

## 2019-10-12 DIAGNOSIS — R062 Wheezing: Secondary | ICD-10-CM

## 2019-10-12 DIAGNOSIS — R0602 Shortness of breath: Secondary | ICD-10-CM

## 2019-10-12 LAB — COMPREHENSIVE METABOLIC PANEL
ALT: 21 U/L (ref 0–44)
AST: 19 U/L (ref 15–41)
Albumin: 3.8 g/dL (ref 3.5–5.0)
Alkaline Phosphatase: 55 U/L (ref 38–126)
Anion gap: 10 (ref 5–15)
BUN: 11 mg/dL (ref 6–20)
CO2: 25 mmol/L (ref 22–32)
Calcium: 8.6 mg/dL — ABNORMAL LOW (ref 8.9–10.3)
Chloride: 105 mmol/L (ref 98–111)
Creatinine, Ser: 0.81 mg/dL (ref 0.44–1.00)
GFR calc Af Amer: >60 mL/min (ref >60)
GFR calc non Af Amer: >60 mL/min (ref >60)
Glucose, Bld: 91 mg/dL (ref 70–99)
Potassium: 3.8 mmol/L (ref 3.5–5.1)
Sodium: 140 mmol/L (ref 135–145)
Total Bilirubin: 0.4 mg/dL (ref 0.3–1.2)
Total Protein: 6.8 g/dL (ref 6.5–8.1)

## 2019-10-12 LAB — CBC WITH DIFFERENTIAL/PLATELET
Abs Immature Granulocytes: 0.01 10*3/uL (ref 0.00–0.07)
Basophils Absolute: 0.1 10*3/uL (ref 0.0–0.1)
Basophils Relative: 1 %
Eosinophils Absolute: 0.1 10*3/uL (ref 0.0–0.5)
Eosinophils Relative: 2 %
HCT: 37.3 % (ref 36.0–46.0)
Hemoglobin: 12.4 g/dL (ref 12.0–15.0)
Immature Granulocytes: 0 %
Lymphocytes Relative: 30 %
Lymphs Abs: 1.9 10*3/uL (ref 0.7–4.0)
MCH: 31.7 pg (ref 26.0–34.0)
MCHC: 33.2 g/dL (ref 30.0–36.0)
MCV: 95.4 fL (ref 80.0–100.0)
Monocytes Absolute: 0.6 10*3/uL (ref 0.1–1.0)
Monocytes Relative: 9 %
Neutro Abs: 3.8 10*3/uL (ref 1.7–7.7)
Neutrophils Relative %: 58 %
Platelets: 179 10*3/uL (ref 150–400)
RBC: 3.91 MIL/uL (ref 3.87–5.11)
RDW: 12.6 % (ref 11.5–15.5)
WBC: 6.5 10*3/uL (ref 4.0–10.5)
nRBC: 0 % (ref 0.0–0.2)

## 2019-10-12 LAB — SARS CORONAVIRUS 2 BY RT PCR (HOSPITAL ORDER, PERFORMED IN ~~LOC~~ HOSPITAL LAB): SARS Coronavirus 2: NEGATIVE

## 2019-10-12 MED ORDER — PREDNISONE 50 MG PO TABS: 50.0000 mg | ORAL_TABLET | Freq: Every day | ORAL | 0 refills | Status: AC

## 2019-10-12 MED ORDER — ALBUTEROL SULFATE HFA 108 (90 BASE) MCG/ACT IN AERS
2.0000 | INHALATION_SPRAY | Freq: Once | RESPIRATORY_TRACT | Status: AC
  Administered 2019-10-12: 2 via RESPIRATORY_TRACT
  Filled 2019-10-12: qty 6.7

## 2019-10-12 NOTE — ED Triage Notes (Signed)
Pt c/o flu like sx x 2 days-loss of smell today-NAD-steady gait

## 2019-10-12 NOTE — ED Provider Notes (Signed)
[facility] EMERGENCY DEPARTMENT Provider Note   CSN: [id number] Arrival date & time: 10/12/19  1943     History Chief Complaint  Patient presents with  . Cough    Charlene Bryant is a 39 y.o. female.  The history is provided by the patient and medical records. No language interpreter was used.  Cough Cough characteristics:  Productive Sputum characteristics:  Nondescript Severity:  Severe Onset quality:  Gradual Duration:  4 days Timing:  Constant Progression:  Unchanged Chronicity:  New Context: sick contacts and upper respiratory infection   Relieved by:  Nothing Worsened by:  Nothing Ineffective treatments:  None tried Associated symptoms: chills, myalgias, rhinorrhea, shortness of breath, sinus congestion and wheezing   Associated symptoms: no chest pain, no diaphoresis, no fever, no headaches, no rash and no weight loss        History reviewed. No pertinent past medical history.  There are no problems to display for this patient.   Past Surgical History:  Procedure Laterality Date  . ABDOMINAL HYSTERECTOMY    . OVARIAN CYST REMOVAL    . TUBAL LIGATION       OB History   No obstetric history on file.     No family history on file.  Social History   Tobacco Use  . Smoking status: Current Every Day Smoker    Types: Cigarettes  . Smokeless tobacco: Never Used  Vaping Use  . Vaping Use: Every day  Substance Use Topics  . Alcohol use: No  . Drug use: No    Home Medications Prior to Admission medications   Medication Sig Start Date End Date Taking? Authorizing Provider  ibuprofen (ADVIL,MOTRIN) 800 MG tablet Take 1 tablet (800 mg total) by mouth every 8 (eight) hours as needed. 04/14/17   Terrilee Files, MD  Loratadine (CLARITIN PO) Take by mouth.    [provider]  ranitidine (ZANTAC) 75 MG tablet Take 75 mg by mouth 2 (two) times daily.    [provider]    Allergies    Amoxicillin  Review of Systems     Review of Systems  Constitutional: Positive for chills and fatigue. Negative for diaphoresis, fever and weight loss.  HENT: Positive for congestion and rhinorrhea.   Eyes: Negative for pain, redness and visual disturbance.  Respiratory: Positive for cough, chest tightness, shortness of breath and wheezing.   Cardiovascular: Negative for chest pain, palpitations and leg swelling.  Gastrointestinal: Negative for abdominal pain, constipation, diarrhea, nausea and vomiting.  Genitourinary: Negative for dysuria, flank pain and frequency.  Musculoskeletal: Positive for myalgias. Negative for back pain, neck pain and neck stiffness.  Skin: Negative for rash and wound.  Neurological: Negative for weakness, light-headedness, numbness and headaches.  Psychiatric/Behavioral: Negative for agitation and confusion.  All other systems reviewed and are negative.   Physical Exam Updated Vital Signs BP 115/63 (BP Location: Right Arm)   Pulse 88   Temp 98.1 F (36.7 C) (Oral)   Resp 19   SpO2 100%   Physical Exam Vitals and nursing note reviewed.  Constitutional:      General: She is not in acute distress.    Appearance: She is well-developed. She is not ill-appearing, toxic-appearing or diaphoretic.  HENT:     Head: Normocephalic and atraumatic.     Nose: Congestion and rhinorrhea present.     Mouth/Throat:     Mouth: Mucous membranes are moist.     Pharynx: No oropharyngeal exudate or posterior oropharyngeal erythema.  Eyes:  Extraocular Movements: Extraocular movements intact.     Conjunctiva/sclera: Conjunctivae normal.     Pupils: Pupils are equal, round, and reactive to light.  Cardiovascular:     Rate and Rhythm: Normal rate and regular rhythm.     Pulses: Normal pulses.     Heart sounds: No murmur heard.   Pulmonary:     Effort: Pulmonary effort is normal. No respiratory distress.     Breath sounds: Normal breath sounds. No wheezing, rhonchi or rales.  Chest:     Chest wall:  No tenderness.  Abdominal:     General: Abdomen is flat.     Palpations: Abdomen is soft.     Tenderness: There is no abdominal tenderness. There is no right CVA tenderness, left CVA tenderness, guarding or rebound.  Musculoskeletal:        General: No tenderness.     Cervical back: Neck supple. No tenderness.     Right lower leg: No edema.     Left lower leg: No edema.  Skin:    General: Skin is warm and dry.     Capillary Refill: Capillary refill takes less than 2 seconds.     Findings: No erythema or rash.  Neurological:     General: No focal deficit present.     Mental Status: She is alert.  Psychiatric:        Mood and Affect: Mood normal.     ED Results / Procedures / Treatments   Labs (all labs ordered are listed, but only abnormal results are displayed) Labs Reviewed  COMPREHENSIVE METABOLIC PANEL - Abnormal; Notable for the following components:      Result Value   Calcium 8.6 (*)    All other components within normal limits  SARS CORONAVIRUS 2 BY RT PCR (HOSPITAL ORDER, PERFORMED IN [facility])  CBC WITH DIFFERENTIAL/PLATELET    EKG EKG Interpretation  Date/Time:  Wednesday October 12 2019 21:22:58 EDT Ventricular Rate:  85 PR Interval:    QRS Duration: 92 QT Interval:  350 QTC Calculation: 417 R Axis:   68 Text Interpretation: Sinus rhythm RSR' in V1 or V2, right VCD or RVH When compared to prior, no significant changes seen. No STEMI Confirmed by Theda Belfast ([id number]) on 10/12/2019 9:54:47 PM   Radiology DG Chest Portable 1 View  Result Date: 10/12/2019 CLINICAL DATA:  Cough, shortness of breath and fatigue EXAM: PORTABLE CHEST 1 VIEW COMPARISON:  Radiograph 06/20/2019 FINDINGS: Increased attenuation towards the lung bases likely secondary to patient body habitus/breast tissue. No consolidation, features of edema, pneumothorax, or effusion. The cardiomediastinal contours are unremarkable. No acute osseous or soft tissue abnormality. IMPRESSION:  No acute cardiopulmonary disease. Electronically Signed   By: Kreg Shropshire M.D.   On: 10/12/2019 21:26    Procedures Procedures (including critical care time)  Medications Ordered in ED Medications  albuterol (VENTOLIN HFA) 108 (90 Base) MCG/ACT inhaler 2 puff (2 puffs Inhalation Given 10/12/19 2109)    ED Course  I have reviewed the triage vital signs and the nursing notes.  Pertinent labs & imaging results that were available during my care of the patient were reviewed by me and considered in my medical decision making (see chart for details).    MDM Rules/Calculators/A&P                          Charlene Bryant is a 39 y.o. female with a past medical history significant for prior hysterectomy and  prior asthma who presents with several days of subjective chills, productive cough, shortness of breath, chest tightness, wheezing, URI symptoms, and loss of smell.  Patient reports that she has not had any Covid vaccination but has not had any known exposures however her daughter has been a summer camp and had URI symptoms over the last several days as well.  Patient has had chest tightness but will not describe true chest pain.  She is reporting shortness of breath with significant coughing with a phlegm-like sputum.  She denies URI symptoms and diffuse soreness and malaise.  She denies significant headache.  She denies any urinary symptoms or GI symptoms.  She is concerned she is dehydrated as well with decreased oral intake as she is feeling fatigued and feeling bad.  On exam, lungs have very faint wheeze but no rhonchi.  Chest and abdomen were nontender.  Back was nontender.  Good pulses in extremities.  No lower extremity tenderness or edema.  No reported history of DVT or PE.  Vital signs reassuring on arrival however patient is very concerned about her breathing and URI symptoms.  Due to the reported chest tightness, shortness of breath, and other URI symptoms in the setting of the ongoing  pandemic, we will get a Covid test sent.  We will also get a chest x-ray and labs as patient feels she is very dehydrated and fatigue.  If her labs are abnormal, will consider rehydration with IV fluids however anticipate she will be stable for discharge home after work-up with work note for several days for likely viral URI.  Covid test negative and labs reassuring.  No evidence of pneumonia.  Suspect viral URI causing her symptoms.  She was able to tolerate p.o. and to maintain hydration at home.  Feel she is safe for discharge home without getting IV fluids at this time given lack of AKI or other changes.  She felt much better after getting albuterol and I do suspect a mild wheezing exacerbation.  We will give her a burst of steroids for the next several days and use her albuterol.    Patient agreed with plan of care and was discharged in good condition with instructions to rest and stay hydrated.  She will follow-up with a PCP.  She understood return precautions and was discharged.   Final Clinical Impression(s) / ED Diagnoses Final diagnoses:  Viral URI with cough  Shortness of breath  Wheezing    Rx / DC Orders ED Discharge Orders         Ordered    predniSONE (DELTASONE) 50 MG tablet  Daily     Discontinue  Reprint     10/12/19 2259         Clinical Impression: 1. Viral URI with cough   2. Shortness of breath   3. Wheezing     Disposition: Discharge  Condition: Good  I have discussed the results, Dx and Tx plan with the pt(& family if present). He/she/they expressed understanding and agree(s) with the plan. Discharge instructions discussed at great length. Strict return precautions discussed and pt &/or family have verbalized understanding of the instructions. No further questions at time of discharge.    Discharge Medication List as of 10/12/2019 11:00 PM    START taking these medications   Details  predniSONE (DELTASONE) 50 MG tablet Take 1 tablet (50 mg total) by mouth  daily for 5 days., Starting Wed 10/12/2019, Until Mon 10/17/2019, Print        Follow  Up: [facility] [address]  [place] [id number] [phone] Schedule an appointment as soon as possible for a visit    [facility] [address] [id number] HQ [id number] [address] [place] [id number]       Sourish Allender, Canary Brimhristopher J, MD 10/13/19 [phone]

## 2019-10-12 NOTE — Discharge Instructions (Addendum)
Your work-up today was negative for Covid.  Your x-ray did not show pneumonia or other abnormality.  Your labs were reassuring.  I suspect a viral upper respiratory infection causing your symptoms that she likely got from your daughter.  Please rest and stay hydrated.  Please follow-up with your primary doctor.  If any symptoms change or worsen, please return to the nearest emergency department immediately.  Please use the albuterol and the steroids given the wheezing episodes you are having has this may be a mild asthma exacerbation.

## 2019-10-12 NOTE — ED Notes (Signed)
Crackers and water given.

## 2020-04-01 ENCOUNTER — Emergency Department (HOSPITAL_BASED_OUTPATIENT_CLINIC_OR_DEPARTMENT_OTHER): Payer: No Typology Code available for payment source

## 2020-04-01 ENCOUNTER — Encounter (HOSPITAL_BASED_OUTPATIENT_CLINIC_OR_DEPARTMENT_OTHER): Payer: Self-pay

## 2020-04-01 ENCOUNTER — Other Ambulatory Visit: Payer: Self-pay

## 2020-04-01 ENCOUNTER — Emergency Department (HOSPITAL_BASED_OUTPATIENT_CLINIC_OR_DEPARTMENT_OTHER)
Admission: EM | Admit: 2020-04-01 | Discharge: 2020-04-01 | Disposition: A | Discharge status: Home / Self Care | Payer: No Typology Code available for payment source | Attending: Emergency Medicine | Admitting: Emergency Medicine

## 2020-04-01 DIAGNOSIS — F17210 Nicotine dependence, cigarettes, uncomplicated: Secondary | ICD-10-CM | POA: Diagnosis not present

## 2020-04-01 DIAGNOSIS — R1084 Generalized abdominal pain: Secondary | ICD-10-CM | POA: Insufficient documentation

## 2020-04-01 DIAGNOSIS — Z90710 Acquired absence of both cervix and uterus: Secondary | ICD-10-CM | POA: Insufficient documentation

## 2020-04-01 DIAGNOSIS — R1010 Upper abdominal pain, unspecified: Secondary | ICD-10-CM | POA: Diagnosis present

## 2020-04-01 DIAGNOSIS — R1110 Vomiting, unspecified: Secondary | ICD-10-CM | POA: Insufficient documentation

## 2020-04-01 LAB — COMPREHENSIVE METABOLIC PANEL
ALT: 12 U/L (ref 0–44)
AST: 17 U/L (ref 15–41)
Albumin: 4.2 g/dL (ref 3.5–5.0)
Alkaline Phosphatase: 46 U/L (ref 38–126)
Anion gap: 8 (ref 5–15)
BUN: 15 mg/dL (ref 6–20)
CO2: 23 mmol/L (ref 22–32)
Calcium: 9 mg/dL (ref 8.9–10.3)
Chloride: 103 mmol/L (ref 98–111)
Creatinine, Ser: 0.62 mg/dL (ref 0.44–1.00)
GFR, Estimated: >60 mL/min (ref >60)
Glucose, Bld: 82 mg/dL (ref 70–99)
Potassium: 4 mmol/L (ref 3.5–5.1)
Sodium: 134 mmol/L — ABNORMAL LOW (ref 135–145)
Total Bilirubin: 0.5 mg/dL (ref 0.3–1.2)
Total Protein: 6.9 g/dL (ref 6.5–8.1)

## 2020-04-01 LAB — CBC
HCT: 39.5 % (ref 36.0–46.0)
Hemoglobin: 13.6 g/dL (ref 12.0–15.0)
MCH: 31.6 pg (ref 26.0–34.0)
MCHC: 34.4 g/dL (ref 30.0–36.0)
MCV: 91.9 fL (ref 80.0–100.0)
Platelets: 196 10*3/uL (ref 150–400)
RBC: 4.3 MIL/uL (ref 3.87–5.11)
RDW: 12 % (ref 11.5–15.5)
WBC: 4.3 10*3/uL (ref 4.0–10.5)
nRBC: 0 % (ref 0.0–0.2)

## 2020-04-01 LAB — URINALYSIS, ROUTINE W REFLEX MICROSCOPIC
Bilirubin Urine: NEGATIVE
Glucose, UA: NEGATIVE mg/dL
Ketones, ur: NEGATIVE mg/dL
Leukocytes,Ua: NEGATIVE
Nitrite: NEGATIVE
Protein, ur: NEGATIVE mg/dL
Specific Gravity, Urine: 1.02 (ref 1.005–1.030)
pH: 6 (ref 5.0–8.0)

## 2020-04-01 LAB — URINALYSIS, MICROSCOPIC (REFLEX): WBC, UA: NONE SEEN WBC/hpf (ref 0–5)

## 2020-04-01 LAB — LIPASE, BLOOD: Lipase: 31 U/L (ref 11–51)

## 2020-04-01 LAB — PREGNANCY, URINE: Preg Test, Ur: NEGATIVE

## 2020-04-01 MED ORDER — LIDOCAINE VISCOUS HCL 2 % MT SOLN
15.0000 mL | Freq: Once | OROMUCOSAL | Status: AC
  Administered 2020-04-01: 15 mL via ORAL
  Filled 2020-04-01: qty 15

## 2020-04-01 MED ORDER — ALUM & MAG HYDROXIDE-SIMETH 200-200-20 MG/5ML PO SUSP
30.0000 mL | Freq: Once | ORAL | Status: AC
  Administered 2020-04-01: 30 mL via ORAL
  Filled 2020-04-01: qty 30

## 2020-04-01 MED ORDER — PANTOPRAZOLE SODIUM 20 MG PO TBEC: 20.0000 mg | DELAYED_RELEASE_TABLET | Freq: Every day | ORAL | 0 refills | Status: AC

## 2020-04-01 MED ORDER — PANTOPRAZOLE SODIUM 40 MG PO TBEC
40.0000 mg | DELAYED_RELEASE_TABLET | Freq: Once | ORAL | Status: AC
  Administered 2020-04-01: 40 mg via ORAL
  Filled 2020-04-01: qty 1

## 2020-04-01 NOTE — ED Triage Notes (Signed)
Pt arrives stating she has burping that smells like "rotten eggs", with reflux type burning, started yesterday. Causing her to vomit. Complains of generalized abdominal pain

## 2020-04-01 NOTE — ED Provider Notes (Signed)
[facility] EMERGENCY DEPARTMENT Provider Note   CSN: [id number] Arrival date & time: 04/01/20  1246     History Chief Complaint  Patient presents with  . Emesis  . Abdominal Pain    Charlene Bryant is a 39 y.o. female.  39 year old female with complaint of upper abdominal discomfort with heartburn and belching.  Patient states her belches smell like rotten eggs and caused her to vomit 1 time.  Also reports loose stools today.  Prior abdominal surgeries include hysterectomy, ovarian cyst removal and tubal ligation.  No known sick contacts, denies fevers, changes in bladder habits or any other complaints or concerns. Took pepto bismal without improvement.         History reviewed. No pertinent past medical history.  There are no problems to display for this patient.   Past Surgical History:  Procedure Laterality Date  . ABDOMINAL HYSTERECTOMY    . OVARIAN CYST REMOVAL    . TUBAL LIGATION       OB History   No obstetric history on file.     History reviewed. No pertinent family history.  Social History   Tobacco Use  . Smoking status: Current Every Day Smoker    Types: Cigarettes  . Smokeless tobacco: Never Used  Vaping Use  . Vaping Use: Every day  Substance Use Topics  . Alcohol use: No  . Drug use: No    Home Medications Prior to Admission medications   Medication Sig Start Date End Date Taking? Authorizing Provider  ibuprofen (ADVIL,MOTRIN) 800 MG tablet Take 1 tablet (800 mg total) by mouth every 8 (eight) hours as needed. 04/14/17   Terrilee Files, MD  Loratadine (CLARITIN PO) Take by mouth.    [provider]  pantoprazole (PROTONIX) 20 MG tablet Take 1 tablet (20 mg total) by mouth daily. 04/01/20   Jeannie Fend, PA-C  ranitidine (ZANTAC) 75 MG tablet Take 75 mg by mouth 2 (two) times daily.    [provider]    Allergies    Amoxicillin  Review of Systems   Review of Systems  Constitutional: Negative for  chills and fever.  Respiratory: Negative for shortness of breath.   Cardiovascular: Negative for chest pain.  Gastrointestinal: Positive for abdominal pain, diarrhea, nausea and vomiting. Negative for blood in stool and constipation.  Genitourinary: Negative for dysuria and frequency.  Skin: Negative for rash and wound.  Allergic/Immunologic: Negative for immunocompromised state.  All other systems reviewed and are negative.   Physical Exam Updated Vital Signs BP 113/72 (BP Location: Left Arm)   Pulse 83   Temp 98 F (36.7 C) (Oral)   Resp 18   Ht 5\' 9"  (1.753 m)   Wt 74.3 kg   SpO2 97%   BMI 24.17 kg/m   Physical Exam Vitals and nursing note reviewed.  Constitutional:      General: She is not in acute distress.    Appearance: She is well-developed and well-nourished. She is not diaphoretic.  HENT:     Head: Normocephalic and atraumatic.  Cardiovascular:     Rate and Rhythm: Normal rate and regular rhythm.     Heart sounds: Normal heart sounds.  Pulmonary:     Effort: Pulmonary effort is normal.     Breath sounds: Normal breath sounds.  Abdominal:     General: Bowel sounds are decreased.     Palpations: Abdomen is soft.     Tenderness: There is no abdominal tenderness.  Skin:  General: Skin is warm and dry.     Findings: No erythema or rash.  Neurological:     Mental Status: She is alert and oriented to person, place, and time.  Psychiatric:        Mood and Affect: Mood and affect normal.        Behavior: Behavior normal.     ED Results / Procedures / Treatments   Labs (all labs ordered are listed, but only abnormal results are displayed) Labs Reviewed  COMPREHENSIVE METABOLIC PANEL - Abnormal; Notable for the following components:      Result Value   Sodium 134 (*)    All other components within normal limits  URINALYSIS, ROUTINE W REFLEX MICROSCOPIC - Abnormal; Notable for the following components:   Hgb urine dipstick SMALL (*)    All other components  within normal limits  URINALYSIS, MICROSCOPIC (REFLEX) - Abnormal; Notable for the following components:   Bacteria, UA MANY (*)    All other components within normal limits  LIPASE, BLOOD  CBC  PREGNANCY, URINE    EKG None  Radiology DG Abd Acute W/Chest  Result Date: 04/01/2020 CLINICAL DATA:  Burping, reflex, vomiting, abdominal pain EXAM: DG ABDOMEN ACUTE WITH 1 VIEW CHEST COMPARISON:  None. FINDINGS: There is no evidence of dilated bowel loops or free intraperitoneal air. No radiopaque calculi or other significant radiographic abnormality is seen. Heart size and mediastinal contours are within normal limits. Both lungs are clear. IMPRESSION: 1.  No acute abnormality of the lungs in frontal projection. 2.  Nonobstructive pattern of bowel gas. Electronically Signed   By: Lauralyn Primes M.D.   On: 04/01/2020 16:41    Procedures Procedures (including critical care time)  Medications Ordered in ED Medications  pantoprazole (PROTONIX) EC tablet 40 mg (has no administration in time range)  alum & mag hydroxide-simeth (MAALOX/MYLANTA) 200-200-20 MG/5ML suspension 30 mL (30 mLs Oral Given 04/01/20 1615)    And  lidocaine (XYLOCAINE) 2 % viscous mouth solution 15 mL (15 mLs Oral Given 04/01/20 1615)    ED Course  I have reviewed the triage vital signs and the nursing notes.  Pertinent labs & imaging results that were available during my care of the patient were reviewed by me and considered in my medical decision making (see chart for details).  Clinical Course as of 04/01/20 1722  Sun Apr 01, 2020  3854 39 year old female with complaint of abdominal pain onset this morning as above. On exam, patient is well appearing, abdomen is soft and non tender. Labs resulted at time of exam and are reassuring including normal CBC, CMP, UA and lipase. Patient was given a GI cocktail, XR abdomen shows non obstructive bowel gas pattern. On reassessment, no further vomiting, no significant change in  pain or abdominal exam. Plan is to start Protonix and recheck with PCP this week, return to ER for worsening or concerning symptoms.  [LM]    Clinical Course User Index [LM] Alden Hipp   MDM Rules/Calculators/A&P                          Final Clinical Impression(s) / ED Diagnoses Final diagnoses:  Generalized abdominal pain    Rx / DC Orders ED Discharge Orders         Ordered    pantoprazole (PROTONIX) 20 MG tablet  Daily        04/01/20 1716  Jeannie Fend, PA-C 04/01/20 1722    Melene Plan, DO 04/01/20 1725

## 2020-04-01 NOTE — Discharge Instructions (Addendum)
Take Protonix daily as prescribed. Follow up with your PCP for recheck this week. Return to the ER for worsening or concerning symptoms.

## 2020-05-04 ENCOUNTER — Emergency Department (HOSPITAL_BASED_OUTPATIENT_CLINIC_OR_DEPARTMENT_OTHER)
Admission: EM | Admit: 2020-05-04 | Discharge: 2020-05-04 | Disposition: A | Discharge status: Home / Self Care | Payer: No Typology Code available for payment source | Attending: Emergency Medicine | Admitting: Emergency Medicine

## 2020-05-04 ENCOUNTER — Encounter (HOSPITAL_BASED_OUTPATIENT_CLINIC_OR_DEPARTMENT_OTHER): Payer: Self-pay | Admitting: *Deleted

## 2020-05-04 ENCOUNTER — Other Ambulatory Visit: Payer: Self-pay

## 2020-05-04 DIAGNOSIS — Z20822 Contact with and (suspected) exposure to covid-19: Secondary | ICD-10-CM | POA: Insufficient documentation

## 2020-05-04 DIAGNOSIS — B349 Viral infection, unspecified: Secondary | ICD-10-CM | POA: Diagnosis not present

## 2020-05-04 DIAGNOSIS — J3489 Other specified disorders of nose and nasal sinuses: Secondary | ICD-10-CM | POA: Diagnosis not present

## 2020-05-04 DIAGNOSIS — F17210 Nicotine dependence, cigarettes, uncomplicated: Secondary | ICD-10-CM | POA: Insufficient documentation

## 2020-05-04 DIAGNOSIS — R519 Headache, unspecified: Secondary | ICD-10-CM | POA: Diagnosis present

## 2020-05-04 LAB — SARS CORONAVIRUS 2 (TAT 6-24 HRS): SARS Coronavirus 2: NEGATIVE

## 2020-05-04 NOTE — ED Triage Notes (Signed)
covid exposure with sx x 3 days, fatigue bodyaches

## 2020-05-04 NOTE — Discharge Instructions (Signed)
You were evaluated today for your headaches and fatigue.  Given your known COVID-19 exposure in your home you have been tested for COVID-19.  You may follow-up your test results on your MyChart app tomorrow, or may receive a call from the emergency department tomorrow with your test results.  Your physical exam and vital signs are very reassuring.  You may continue to take Tylenol ibuprofen as needed at home for symptom management.  Encourage increased hydration.  Return to the emergency department if you develop any chest pain, difficulty breathing, nausea or vomiting that does not stop, or any other new severe symptoms.

## 2020-05-04 NOTE — ED Provider Notes (Signed)
[facility] EMERGENCY DEPARTMENT Provider Note   CSN: [id number] Arrival date & time: 05/04/20  1335     History Chief Complaint  Patient presents with  . Covid Exposure   Charlene Bryant is a 40 y.o. female who presents with 3 days of headache and fatigue.  Patient has known COVID-19 exposure in her home, as her son tested positive for COVID-19 yesterday. Patient has completed 2 doses of the COVID-19 vaccination, second dose completed in August 2021.  Denies any chest pain, shortness of breath, palpitations, abdominal pain, nausea, vomiting, diarrhea, syncope, loss of taste or smell.  She does endorse some rhinorrhea and congestion.  Additionally patient endorsed symptoms irritation of her external genitalia after beginning to use scented vaginal powder.  Denies any vaginal bleeding or discharge, denies any skin changes in the area.  She states she has not been sexually active for many years.  She has had an abdominal hysterectomy and therefore cannot be pregnant.  Have personally reviewed this patient's medical records.  History of GERD, abdominal hysterectomy. She is on daily PPI, but otherwise carries no medical diagnoses and is not on any medications every day.    HPI     History reviewed. No pertinent past medical history.  There are no problems to display for this patient.   Past Surgical History:  Procedure Laterality Date  . ABDOMINAL HYSTERECTOMY    . OVARIAN CYST REMOVAL    . TUBAL LIGATION       OB History   No obstetric history on file.     No family history on file.  Social History   Tobacco Use  . Smoking status: Current Every Day Smoker    Types: Cigarettes  . Smokeless tobacco: Never Used  Vaping Use  . Vaping Use: Every day  Substance Use Topics  . Alcohol use: No  . Drug use: No    Home Medications Prior to Admission medications   Medication Sig Start Date End Date Taking? Authorizing Provider  ibuprofen (ADVIL,MOTRIN) 800 MG  tablet Take 1 tablet (800 mg total) by mouth every 8 (eight) hours as needed. 04/14/17   Terrilee Files, MD  Loratadine (CLARITIN PO) Take by mouth.    [provider]  pantoprazole (PROTONIX) 20 MG tablet Take 1 tablet (20 mg total) by mouth daily. 04/01/20   Jeannie Fend, PA-C  ranitidine (ZANTAC) 75 MG tablet Take 75 mg by mouth 2 (two) times daily.    [provider]    Allergies    Amoxicillin  Review of Systems   Review of Systems  Constitutional: Positive for fatigue. Negative for activity change, appetite change, chills and fever.  HENT: Positive for congestion and rhinorrhea. Negative for sore throat, trouble swallowing and voice change.   Eyes: Negative.   Respiratory: Negative.   Cardiovascular: Negative.   Gastrointestinal: Negative.   Endocrine: Negative.   Genitourinary: Negative.   Musculoskeletal: Negative.   Skin: Negative.   Allergic/Immunologic: Negative.   Neurological: Positive for headaches. Negative for dizziness, syncope, weakness and light-headedness.  Hematological: Negative.   Psychiatric/Behavioral: Negative.     Physical Exam Updated Vital Signs BP 112/60   Pulse 83   Temp 97.9 F (36.6 C)   Resp 18   Ht 5\' 7"  (1.702 m)   Wt 70.8 kg   SpO2 100%   BMI 24.43 kg/m   Physical Exam Vitals and nursing note reviewed.  Constitutional:      Appearance: She is not ill-appearing.  HENT:  Head: Normocephalic and atraumatic.     Nose: Rhinorrhea present.     Mouth/Throat:     Mouth: Mucous membranes are moist.     Pharynx: Oropharynx is clear. Uvula midline. No oropharyngeal exudate or posterior oropharyngeal erythema.     Tonsils: No tonsillar exudate.  Eyes:     General: Lids are normal. Vision grossly intact.        Right eye: No discharge.        Left eye: No discharge.     Extraocular Movements: Extraocular movements intact.     Conjunctiva/sclera: Conjunctivae normal.     Pupils: Pupils are equal, round, and  reactive to light.  Neck:     Trachea: Trachea and phonation normal.  Cardiovascular:     Rate and Rhythm: Normal rate and regular rhythm.     Pulses: Normal pulses.     Heart sounds: Normal heart sounds. No murmur heard.   Pulmonary:     Effort: Pulmonary effort is normal. No bradypnea, accessory muscle usage, prolonged expiration, respiratory distress or retractions.     Breath sounds: Normal breath sounds. No wheezing or rales.  Chest:     Chest wall: No lacerations, deformity, swelling, tenderness, crepitus or edema.  Abdominal:     General: Bowel sounds are normal. There is no distension.     Palpations: Abdomen is soft.     Tenderness: There is no abdominal tenderness. There is no guarding or rebound.  Genitourinary:    Comments: GU exam offered given patient's endorsed external genital irritation; patient declined GU exam and said "forget I ever mentioned anything about it". Musculoskeletal:        General: No deformity.     Cervical back: Full passive range of motion without pain, normal range of motion and neck supple. No rigidity or crepitus. No pain with movement, spinous process tenderness or muscular tenderness.     Right lower leg: No edema.     Left lower leg: No edema.  Lymphadenopathy:     Cervical: No cervical adenopathy.  Skin:    General: Skin is warm and dry.     Capillary Refill: Capillary refill takes less than 2 seconds.  Neurological:     General: No focal deficit present.     Mental Status: She is alert and oriented to person, place, and time.     Cranial Nerves: Cranial nerves are intact.     Sensory: Sensation is intact.     Motor: Motor function is intact.     Gait: Gait is intact.  Psychiatric:        Mood and Affect: Mood normal.     ED Results / Procedures / Treatments   Labs (all labs ordered are listed, but only abnormal results are displayed) Labs Reviewed  SARS CORONAVIRUS 2 (TAT 6-24 HRS)   EKG None  Radiology No results  found.  Procedures Procedures   Medications Ordered in ED Medications - No data to display  ED Course  I have reviewed the triage vital signs and the nursing notes.  Pertinent labs & imaging results that were available during my care of the patient were reviewed by me and considered in my medical decision making (see chart for details).    MDM Rules/Calculators/A&P                         40 y/o female who presents with 3 days of headache and fatigue after exposure to Covid from  her son in her home.  She has been vaccinated x2 against COVID-19.  Patient's vital signs are normal on intake.  Physical exam is very reassuring.  Cardiopulmonary exam is normal, abdominal exam is benign.  Patient is neurovascular intact in all 4 extremities.  Will proceed with COVID-19 test given patient's known exposure and current symptoms.  Regarding patient's GU symptoms, patient deferred GU exam in the emergency department today.  Encouraged her to discontinue usage of external fragrance to her genital area and to follow-up with your GYN if she continues to have issues.   Given reassuring physical exam and vital signs, no further work-up is warranted in the ED at this time.  Patient may follow-up on her COVID-19 test in her MyChart app.  Encouraged hydration and close follow-up with her PCP.  Charlene Bryant voiced understanding of her medical evaluation and treatment plan.  Each of her questions was answered to her expressed satisfaction.  Return precautions given. Patient is well-appearing, stable, and appropriate for discharge at this time.  Charlene Bryant was evaluated in Emergency Department on 05/04/2020 for the symptoms described in the history of present illness. She was evaluated in the context of the global COVID-19 pandemic, which necessitated consideration that the patient might be at risk for infection with the SARS-CoV-2 virus that causes COVID-19. Institutional protocols and algorithms that pertain to  the evaluation of patients at risk for COVID-19 are in a state of rapid change based on information released by regulatory bodies including the CDC and federal and state organizations. These policies and algorithms were followed during the patient's care in the ED.  This chart was dictated using voice recognition software, Dragon. Despite the best efforts of this provider to proofread and correct errors, errors may still occur which can change documentation meaning.  Final Clinical Impression(s) / ED Diagnoses Final diagnoses:  Viral illness    Rx / DC Orders ED Discharge Orders    None       Sherrilee Gilles 05/04/20 1545    Alvira Monday, MD 05/06/20 1115

## 2020-05-20 ENCOUNTER — Other Ambulatory Visit: Payer: Self-pay

## 2020-05-20 ENCOUNTER — Encounter (HOSPITAL_BASED_OUTPATIENT_CLINIC_OR_DEPARTMENT_OTHER): Payer: Self-pay | Admitting: *Deleted

## 2020-05-20 ENCOUNTER — Emergency Department (HOSPITAL_BASED_OUTPATIENT_CLINIC_OR_DEPARTMENT_OTHER)
Admission: EM | Admit: 2020-05-20 | Discharge: 2020-05-20 | Disposition: A | Discharge status: Home / Self Care | Payer: No Typology Code available for payment source | Attending: Emergency Medicine | Admitting: Emergency Medicine

## 2020-05-20 DIAGNOSIS — Z98818 Other dental procedure status: Secondary | ICD-10-CM | POA: Diagnosis not present

## 2020-05-20 DIAGNOSIS — Z4802 Encounter for removal of sutures: Secondary | ICD-10-CM | POA: Diagnosis not present

## 2020-05-20 DIAGNOSIS — F17210 Nicotine dependence, cigarettes, uncomplicated: Secondary | ICD-10-CM | POA: Insufficient documentation

## 2020-05-20 DIAGNOSIS — S01512D Laceration without foreign body of oral cavity, subsequent encounter: Secondary | ICD-10-CM | POA: Insufficient documentation

## 2020-05-20 DIAGNOSIS — X58XXXD Exposure to other specified factors, subsequent encounter: Secondary | ICD-10-CM | POA: Insufficient documentation

## 2020-05-20 DIAGNOSIS — Z9889 Other specified postprocedural states: Secondary | ICD-10-CM

## 2020-05-20 NOTE — ED Notes (Signed)
Patient informed that oral stitches are normally dissolvable and that we do not take out oral stitches anyway, provider advised that she could give her a list of dental resources to have the stitches evaluated/removed but patient declined.

## 2020-05-20 NOTE — ED Notes (Signed)
Patient unhappy that sutures were not removed, given supervisor's card.  List of dental resources were given with d/c paperwork.

## 2020-05-20 NOTE — ED Notes (Addendum)
Pt requested to speak with someone at registration about her visit. Pt verbalized that she doesn't understand why her oral sutures cant be removed by our provider. Pt was informed that we dont remove oral stitches, and that the sutures are probably dissolvable. Pt kept insisting that sutures be removed. It was recommended by this RN that pt call a dental provider to inquire how long it takes for the sutures to dissolve. Pt was given [facility] card if she has need for additional concerns.

## 2020-05-20 NOTE — ED Provider Notes (Signed)
[facility] EMERGENCY DEPARTMENT Provider Note   CSN: [id number] Arrival date & time: 05/20/20  1340     History Chief Complaint  Patient presents with  . Suture / Staple Removal    Charlene Bryant is a 40 y.o. female who presents to the ED today for suture removals. Pt reports that she had a dental procedure done on Monday - she states that she had to have sutures placed in her left upper dental area and had dentures placed as well. Pt states that she had to go to another dental office the next day as she did not have a good experience at this dentist and felt like the dentures placed were too large for her mouth. She has had new dentures since however does not want to follow up with the first dentist due to the bad experience and is here today requesting that the sutures in her mouth be removed. Pt cannot recall how many sutures were placed and she is unsure when they needed to be removed. She denies any fevers, chills, drainage, or any other associated symptoms.   The history is provided by the patient and medical records.       History reviewed. No pertinent past medical history.  There are no problems to display for this patient.   Past Surgical History:  Procedure Laterality Date  . ABDOMINAL HYSTERECTOMY    . denture fitting    . OVARIAN CYST REMOVAL    . TUBAL LIGATION       OB History   No obstetric history on file.     No family history on file.  Social History   Tobacco Use  . Smoking status: Current Every Day Smoker    Types: Cigarettes  . Smokeless tobacco: Never Used  Vaping Use  . Vaping Use: Every day  Substance Use Topics  . Alcohol use: No  . Drug use: No    Home Medications Prior to Admission medications   Medication Sig Start Date End Date Taking? Authorizing Provider  ibuprofen (ADVIL,MOTRIN) 800 MG tablet Take 1 tablet (800 mg total) by mouth every 8 (eight) hours as needed. 04/14/17   Terrilee Files, MD  Loratadine (CLARITIN  PO) Take by mouth.    [provider]  pantoprazole (PROTONIX) 20 MG tablet Take 1 tablet (20 mg total) by mouth daily. 04/01/20   Jeannie Fend, PA-C  ranitidine (ZANTAC) 75 MG tablet Take 75 mg by mouth 2 (two) times daily.    [provider]    Allergies    Amoxicillin  Review of Systems   Review of Systems  Constitutional: Negative for chills and fever.  HENT: Positive for dental problem. Negative for facial swelling.   All other systems reviewed and are negative.   Physical Exam Updated Vital Signs BP 137/71 (BP Location: Right Arm)   Pulse 79   Temp 97.8 F (36.6 C) (Oral)   Resp 16   Ht 5\' 7"  (1.702 m)   Wt 69.9 kg   SpO2 100%   BMI 24.12 kg/m   Physical Exam Vitals and nursing note reviewed.  Constitutional:      Appearance: She is not ill-appearing.  HENT:     Head: Normocephalic and atraumatic.     Mouth/Throat:      Comments: Dentures removed. Extraction holes noted above; 1 absorbable suture identified. No gingival erythema, edema, or drainage appreciated. No definitive abscess. No signs of ludwig's angina.  Eyes:     Conjunctiva/sclera: Conjunctivae  normal.  Cardiovascular:     Rate and Rhythm: Normal rate and regular rhythm.     Pulses: Normal pulses.  Pulmonary:     Effort: Pulmonary effort is normal.     Breath sounds: Normal breath sounds. No wheezing, rhonchi or rales.  Skin:    General: Skin is warm and dry.     Coloration: Skin is not jaundiced.  Neurological:     Mental Status: She is alert.     ED Results / Procedures / Treatments   Labs (all labs ordered are listed, but only abnormal results are displayed) Labs Reviewed - No data to display  EKG None  Radiology No results found.  Procedures Procedures   Medications Ordered in ED Medications - No data to display  ED Course  I have reviewed the triage vital signs and the nursing notes.  Pertinent labs & imaging results that were available during my care  of the patient were reviewed by me and considered in my medical decision making (see chart for details).    MDM Rules/Calculators/A&P                          41 year old female presents to the ED today requesting that sutures be removed from her previous tooth extractions performed this past Monday.  She has not followed up with her dentist because she had a bad experience.  She does not know how many sutures are in place and when they were required to be removed.  She does not know if they are absorbable or nonabsorbable.  On arrival to the ED vitals are stable.  Patient is afebrile, nontachycardic and nontachypneic and appears to be in no acute distress.  I am unable to see any information in our system regarding her recent procedure.  It does appear she had teeth extracted and dentures placed.  Her dentures are removed during time of physical exam.  She has 2 tooth extraction holes noted to the left upper dental area.  I am able to visualize 1 absorbable suture however difficult to assess if there are any others.  She has no signs of infection to the gumline.  No signs of Ludwig's angina.  I had lengthy discussion with patient regarding the fact that she would need to follow-up with a dentist regarding this as I cannot tell how many sutures are in/when they need to be removed. The suture does appear to be absorbable at this time and I discussed with patient that it will dissolve on its own.  Patient is given a list of dental resources in the area to follow-up with and stable for discharge home.   This note was prepared using Dragon voice recognition software and may include unintentional dictation errors due to the inherent limitations of voice recognition software.  Final Clinical Impression(s) / ED Diagnoses Final diagnoses:  History of recent dental procedure    Rx / DC Orders ED Discharge Orders    None       Discharge Instructions     Please see attached list of resources for  dentists in the area to follow up with for your sutures       Tanda Rockers, PA-C 05/20/20 1422    Alvira Monday, MD 05/20/20 2346

## 2020-05-20 NOTE — Discharge Instructions (Addendum)
Please see attached list of resources for dentists in the area to follow up with for your sutures

## 2020-05-20 NOTE — ED Triage Notes (Signed)
Pt reports she is here for suture removal in her mouth. States she has stitches in her mouth from injury during denture fitting

## 2020-10-03 ENCOUNTER — Encounter (HOSPITAL_BASED_OUTPATIENT_CLINIC_OR_DEPARTMENT_OTHER): Payer: Self-pay | Admitting: *Deleted

## 2020-10-03 ENCOUNTER — Other Ambulatory Visit: Payer: Self-pay

## 2020-10-03 ENCOUNTER — Emergency Department (HOSPITAL_BASED_OUTPATIENT_CLINIC_OR_DEPARTMENT_OTHER)
Admission: EM | Admit: 2020-10-03 | Discharge: 2020-10-03 | Disposition: A | Discharge status: Home / Self Care | Payer: Medicaid Other | Attending: Emergency Medicine | Admitting: Emergency Medicine

## 2020-10-03 DIAGNOSIS — Z5321 Procedure and treatment not carried out due to patient leaving prior to being seen by health care provider: Secondary | ICD-10-CM | POA: Insufficient documentation

## 2020-10-03 DIAGNOSIS — R519 Headache, unspecified: Secondary | ICD-10-CM | POA: Insufficient documentation

## 2020-10-03 MED ORDER — DIPHENHYDRAMINE HCL 25 MG PO CAPS: 25.0000 mg | ORAL_CAPSULE | Freq: Once | ORAL | Status: DC

## 2020-10-03 MED ORDER — SODIUM CHLORIDE 0.9 % IV BOLUS: 1000.0000 mL | Freq: Once | INTRAVENOUS | Status: DC

## 2020-10-03 MED ORDER — ACETAMINOPHEN 500 MG PO TABS: 1000.0000 mg | ORAL_TABLET | Freq: Once | ORAL | Status: DC

## 2020-10-03 MED ORDER — KETOROLAC TROMETHAMINE 15 MG/ML IJ SOLN: 15.0000 mg | Freq: Once | INTRAMUSCULAR | Status: DC

## 2020-10-03 MED ORDER — METOCLOPRAMIDE HCL 5 MG/ML IJ SOLN: 10.0000 mg | Freq: Once | INTRAMUSCULAR | Status: DC

## 2020-10-03 NOTE — ED Notes (Signed)
Went to room to speak with pt per her request. Pt very agitated stating the PA acted like he didn't believe her and she wants to leave. Pt is cursing at this nurse and unhappy with care. Advised pt she was free leave. Advised pt that the PA had ordered blood work and medications for her. Pt states she will stay for treatment.

## 2020-10-03 NOTE — ED Notes (Signed)
Pt arguing in triage , states " why did that women go back before me when I was here first" instructed pt that we triage by acuity , age and complaint"

## 2020-10-03 NOTE — ED Notes (Signed)
[place] PA aware of pt elopement.

## 2020-10-03 NOTE — ED Notes (Signed)
ED Provider at bedside. 

## 2020-10-03 NOTE — ED Provider Notes (Signed)
Patient is a 40 year old female presenting today for headaches of been going on for 1 week.  She states that they have circumferential.  She is concerned about her blood pressure being elevated she states that she is worse.  She states that she is very stressed at work and feels that her anxiety is getting her down.  She states that she has been so stressed that she recently thought about walking out in traffic.  She states she does not actively feel like killing herself at this time.  She is pleasant during my examination and answers questions appropriately follows commands.  She seems to be interested in getting a doctor's note.  I recommended that we treat patient's headache and evaluate her for her medical issues and then have a psychiatrist speak to her.  She is agreeable to this.  I left the patient's room with her endorsing a plan and treatment strategy.  I went ahead and obtained psychiatric clearance labs to this and however per RN patient left after my evaluation and states that she felt that she was not believed.  Patient left/eloped before RN was able to stop her.   Gailen Shelter, [place] 10/03/20 2334    Paula Libra, MD 10/04/20 [phone]

## 2020-10-03 NOTE — ED Notes (Addendum)
Pt returned to desk stating she wants a doctor notes. Advised pt that we do not give doctors notes unless treatment is completed. Pt continues to curse and be argumentative. Pt visualized ambulatory out of department.

## 2020-10-03 NOTE — ED Notes (Signed)
Pt now to desk stating she wants to leave and being very argumentative. Advised pt that she was free to leave.

## 2020-10-03 NOTE — ED Triage Notes (Addendum)
C/o h/a, insomnia, increased BP, increased stress,   x 1 week. Pt reports PMD increased Wellbutrin from 150mg  to 300mg  recently

## 2021-04-16 IMAGING — DX DG ABDOMEN ACUTE W/ 1V CHEST
3 series · 3 of 3 positions shown · non-contrast
Comparison: None.

CLINICAL DATA: Burping, reflex, vomiting, abdominal pain

EXAM:
DG ABDOMEN ACUTE WITH 1 VIEW CHEST

[abdomen supine]
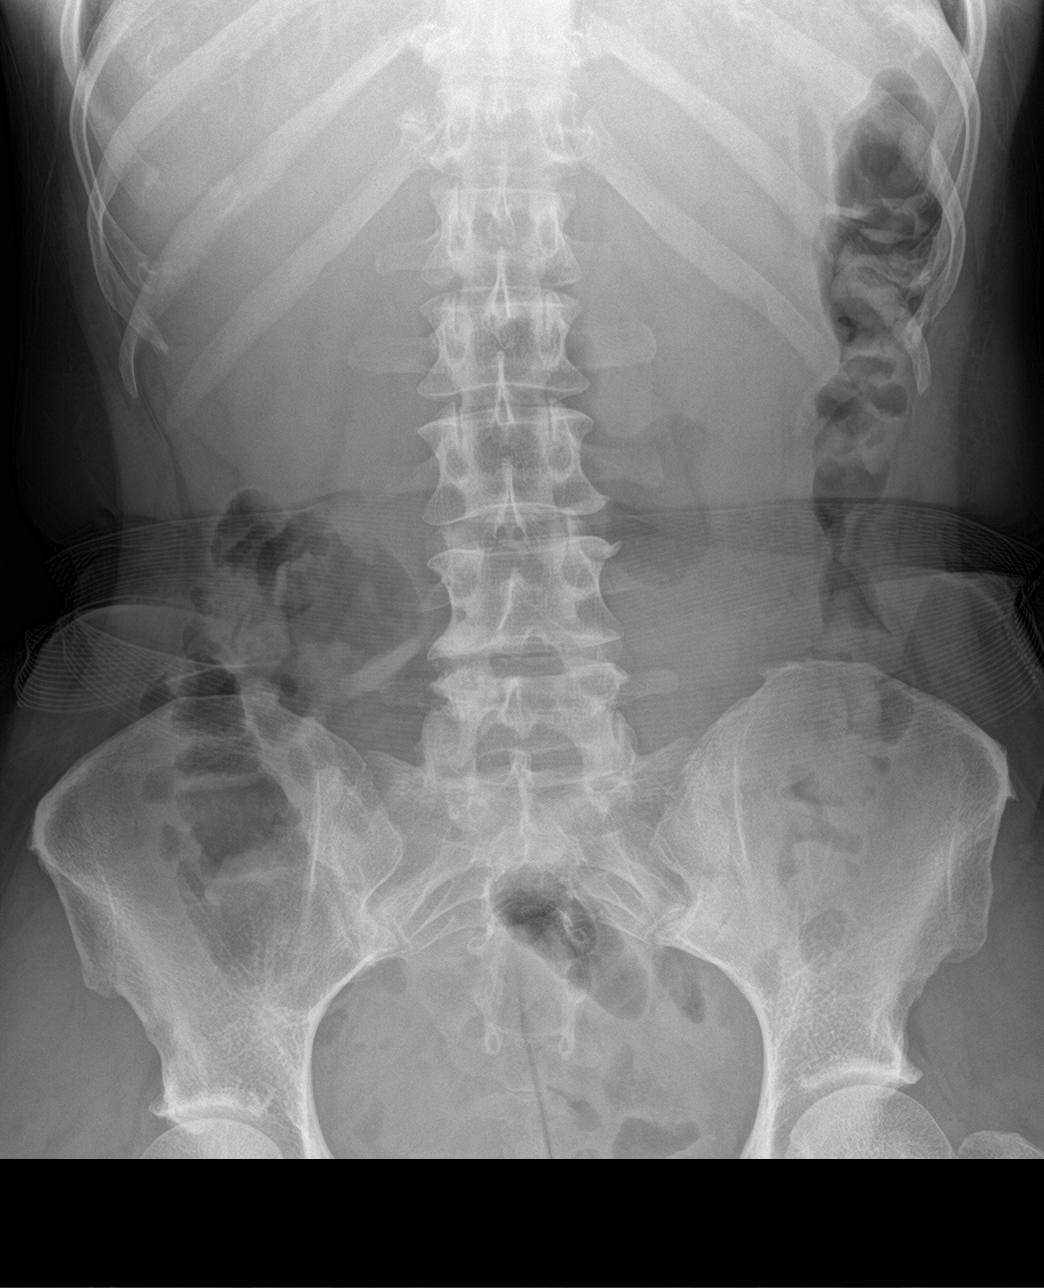

[chest pa]
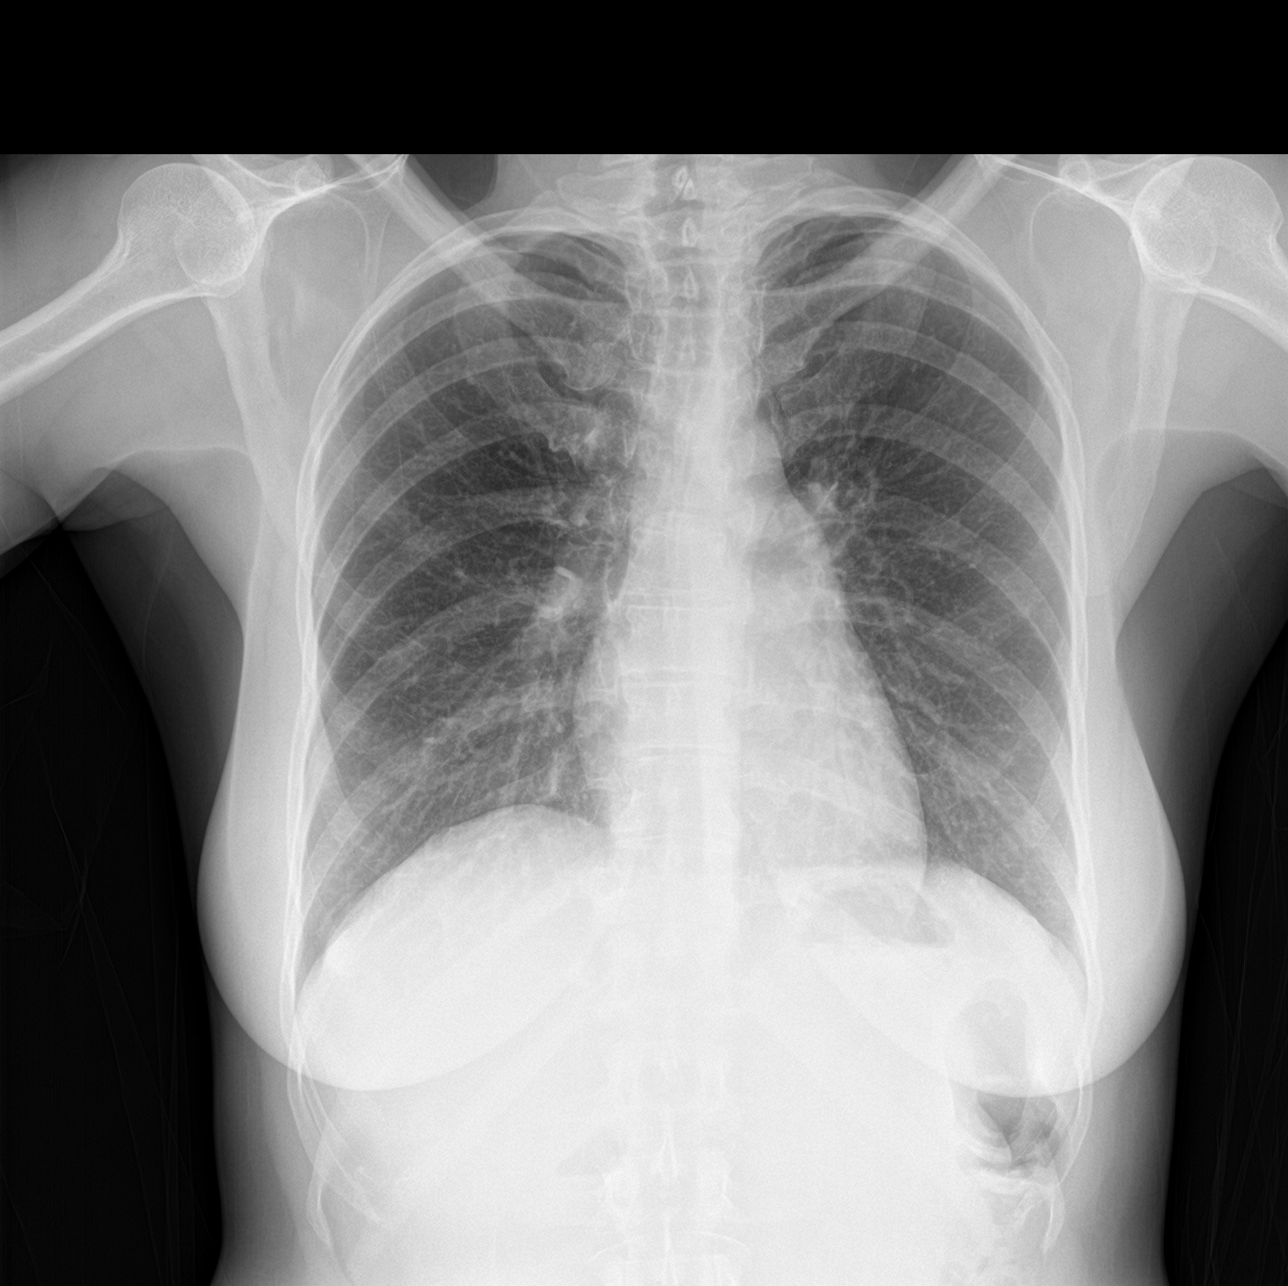

[abdomen erect]
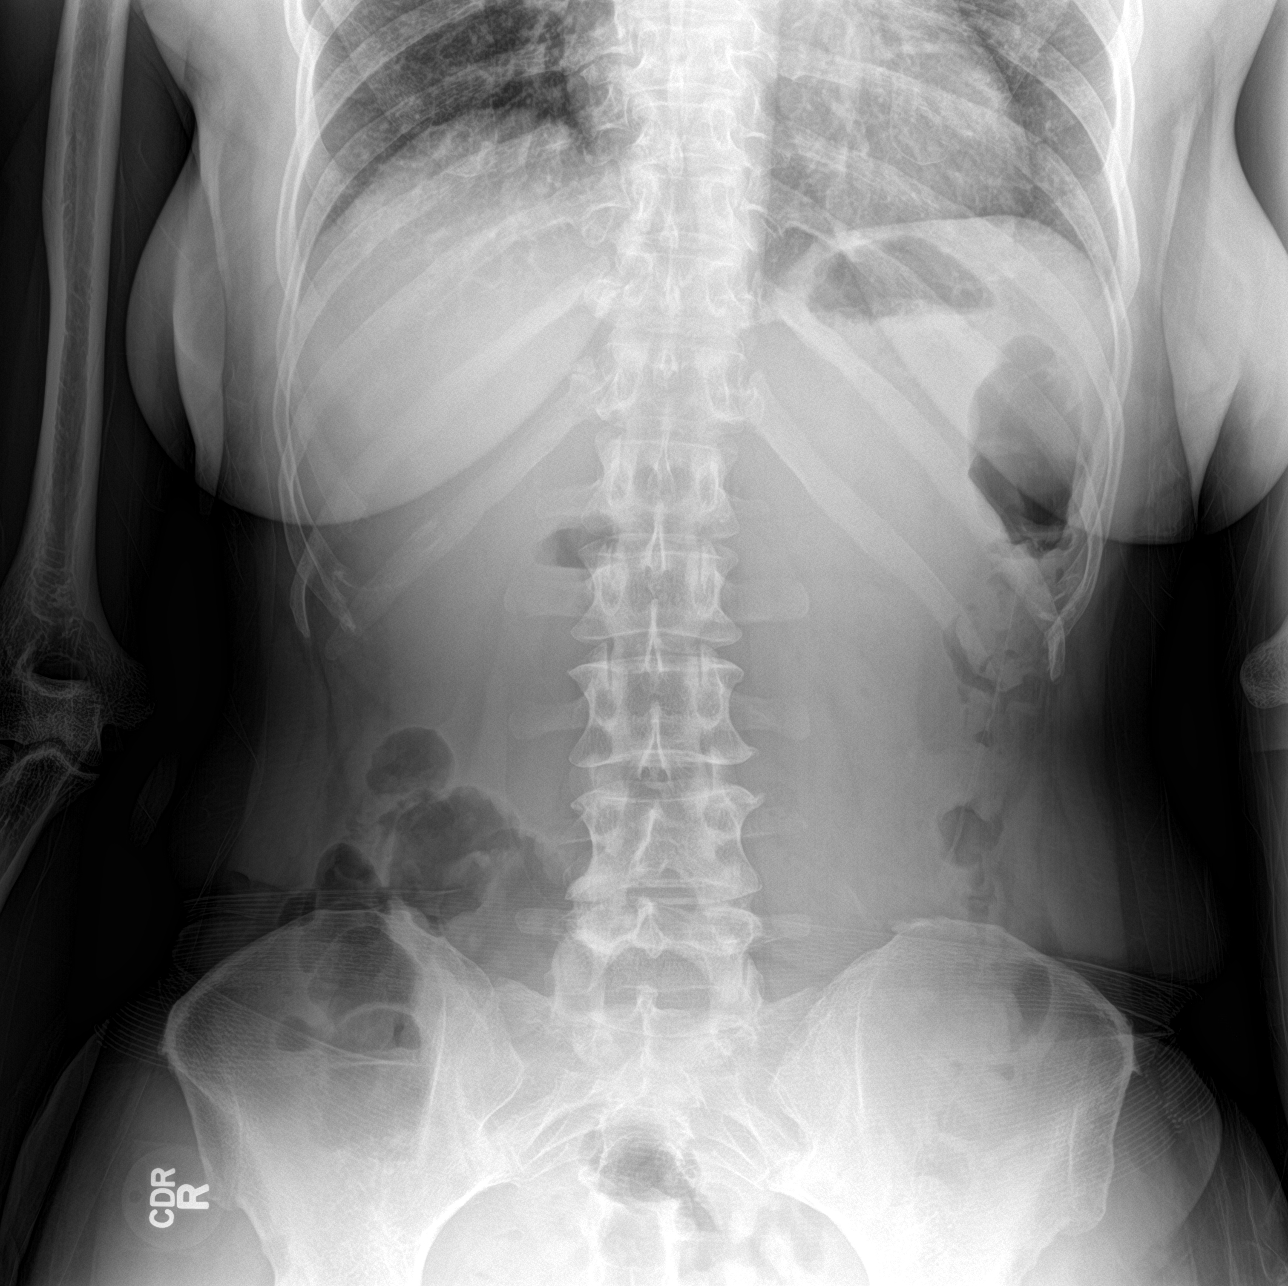

[3 of 3 positions shown; findings below may reference images not displayed]

FINDINGS: There is no evidence of dilated bowel loops or free intraperitoneal
air. No radiopaque calculi or other significant radiographic
abnormality is seen. Heart size and mediastinal contours are within
normal limits. Both lungs are clear.
IMPRESSION: 1.  No acute abnormality of the lungs in frontal projection.

2.  Nonobstructive pattern of bowel gas.

## 2021-12-18 ENCOUNTER — Emergency Department (HOSPITAL_BASED_OUTPATIENT_CLINIC_OR_DEPARTMENT_OTHER)
Admission: EM | Admit: 2021-12-18 | Discharge: 2021-12-18 | Disposition: A | Discharge status: Home / Self Care | Payer: Medicaid Other | Attending: Emergency Medicine | Admitting: Emergency Medicine

## 2021-12-18 ENCOUNTER — Encounter (HOSPITAL_BASED_OUTPATIENT_CLINIC_OR_DEPARTMENT_OTHER): Payer: Self-pay | Admitting: Emergency Medicine

## 2021-12-18 ENCOUNTER — Other Ambulatory Visit: Payer: Self-pay

## 2021-12-18 ENCOUNTER — Emergency Department (HOSPITAL_BASED_OUTPATIENT_CLINIC_OR_DEPARTMENT_OTHER): Payer: Medicaid Other

## 2021-12-18 DIAGNOSIS — F17210 Nicotine dependence, cigarettes, uncomplicated: Secondary | ICD-10-CM | POA: Diagnosis not present

## 2021-12-18 DIAGNOSIS — B9789 Other viral agents as the cause of diseases classified elsewhere: Secondary | ICD-10-CM | POA: Diagnosis not present

## 2021-12-18 DIAGNOSIS — Z20822 Contact with and (suspected) exposure to covid-19: Secondary | ICD-10-CM | POA: Insufficient documentation

## 2021-12-18 DIAGNOSIS — J988 Other specified respiratory disorders: Secondary | ICD-10-CM | POA: Insufficient documentation

## 2021-12-18 DIAGNOSIS — R0602 Shortness of breath: Secondary | ICD-10-CM | POA: Diagnosis present

## 2021-12-18 LAB — SARS CORONAVIRUS 2 BY RT PCR: SARS Coronavirus 2 by RT PCR: NEGATIVE

## 2021-12-18 MED ORDER — ONDANSETRON 8 MG PO TBDP: 8.0000 mg | ORAL_TABLET | Freq: Three times a day (TID) | ORAL | 0 refills | Status: AC | PRN

## 2021-12-18 MED ORDER — ALBUTEROL SULFATE HFA 108 (90 BASE) MCG/ACT IN AERS
2.0000 | INHALATION_SPRAY | RESPIRATORY_TRACT | Status: DC | PRN
  Administered 2021-12-18: 2 via RESPIRATORY_TRACT
  Filled 2021-12-18: qty 6.7

## 2021-12-18 MED ORDER — ONDANSETRON 4 MG PO TBDP
8.0000 mg | ORAL_TABLET | Freq: Once | ORAL | Status: AC
  Administered 2021-12-18: 8 mg via ORAL
  Filled 2021-12-18: qty 2

## 2021-12-18 MED ORDER — ACETAMINOPHEN 500 MG PO TABS
1000.0000 mg | ORAL_TABLET | Freq: Once | ORAL | Status: AC
  Administered 2021-12-18: 1000 mg via ORAL
  Filled 2021-12-18: qty 2

## 2021-12-18 NOTE — ED Provider Notes (Signed)
[facility] Provider Note: Charlene Dell, MD, FACEP  CSN: [id number] MRN: [id number] ARRIVAL: 12/18/21 at 0449 ROOM: [facility]05/[facility]05   CHIEF COMPLAINT  Shortness of Breath   HISTORY OF PRESENT ILLNESS  12/18/21 5:05 AM Charlene Bryant is a 41 y.o. female who has had several coworkers with COVID recently.  She was at work this morning and had the fairly sudden onset of general malaise, weakness, headache, sore throat, nausea, and shortness of breath.  This began about 90 minutes ago.  She denies nasal congestion or cough but is having sinus discomfort.  She rates this discomfort as a 10 out of 10.  She has not taken anything for this.   History reviewed. No pertinent past medical history.  Past Surgical History:  Procedure Laterality Date   ABDOMINAL HYSTERECTOMY     denture fitting     OVARIAN CYST REMOVAL     TUBAL LIGATION      Family History  Problem Relation Age of Onset   Diabetes Other    Cancer Other     Social History   Tobacco Use   Smoking status: Every Day    Packs/day: 1.00    Types: Cigarettes   Smokeless tobacco: Never  Vaping Use   Vaping Use: Every day   Substances: Nicotine, CBD  Substance Use Topics   Alcohol use: No   Drug use: No    Prior to Admission medications   Medication Sig Start Date End Date Taking? Authorizing Provider  ondansetron (ZOFRAN-ODT) 8 MG disintegrating tablet Take 1 tablet (8 mg total) by mouth every 8 (eight) hours as needed for nausea or vomiting. 12/18/21  Yes Lenward Able, MD  amphetamine-dextroamphetamine (ADDERALL) 30 MG tablet Take 1 tablet by mouth daily. 05/18/20   [provider]  buPROPion (WELLBUTRIN SR) 150 MG 12 hr tablet Take by mouth.    [provider]  ibuprofen (ADVIL,MOTRIN) 800 MG tablet Take 1 tablet (800 mg total) by mouth every 8 (eight) hours as needed. 04/14/17   Terrilee Files, MD  Loratadine (CLARITIN PO) Take by mouth.    [provider]  pantoprazole  (PROTONIX) 20 MG tablet Take 1 tablet (20 mg total) by mouth daily. 04/01/20   Jeannie Fend, PA-C  ranitidine (ZANTAC) 75 MG tablet Take 75 mg by mouth 2 (two) times daily.    [provider]    Allergies Amoxicillin   REVIEW OF SYSTEMS  Negative except as noted here or in the History of Present Illness.   PHYSICAL EXAMINATION  Initial Vital Signs Blood pressure 118/63, pulse 61, temperature 98.2 F (36.8 C), temperature source Oral, resp. rate 16, height 5\' 7"  (1.702 m), weight 73.5 kg, SpO2 100 %.  Examination General: Well-developed, well-nourished female in no acute distress; appearance consistent with age of record HENT: normocephalic; atraumatic; pharynx normal Eyes: pupils equal, round and reactive to light; extraocular muscles intact Neck: supple Heart: regular rate and rhythm Lungs: Decreased breath sounds on the left Abdomen: soft; nondistended; nontender; bowel sounds present Extremities: No deformity; full range of motion; pulses normal Neurologic: Awake, alert and oriented; motor function intact in all extremities and symmetric; no facial droop Skin: Warm and dry Psychiatric: Flat affect   RESULTS  Summary of this visit's results, reviewed and interpreted by myself:   EKG Interpretation  Date/Time:    Ventricular Rate:    PR Interval:    QRS Duration:   QT Interval:    QTC Calculation:   R Axis:  Text Interpretation:         Laboratory Studies: Results for orders placed or performed during the hospital encounter of 12/18/21 (from the past 24 hour(s))  SARS Coronavirus 2 by RT PCR (hospital order, performed in [facility] hospital lab) *cepheid single result test* Anterior Nasal Swab     Status: None   Collection Time: 12/18/21  5:08 AM   Specimen: Anterior Nasal Swab  Result Value Ref Range   SARS Coronavirus 2 by RT PCR NEGATIVE NEGATIVE   Imaging Studies: DG Chest 2 View  Result Date: 12/18/2021 CLINICAL DATA:  Shortness of  breath. EXAM: CHEST - 2 VIEW COMPARISON:  04/01/2020 FINDINGS: The lungs are clear without focal pneumonia, edema, pneumothorax or pleural effusion. The cardiopericardial silhouette is within normal limits for size. The visualized bony structures of the thorax are unremarkable. IMPRESSION: No active cardiopulmonary disease. Electronically Signed   By: Kennith Center M.D.   On: 12/18/2021 05:57    ED COURSE and MDM  Nursing notes, initial and subsequent vitals signs, including pulse oximetry, reviewed and interpreted by myself.  Vitals:   12/18/21 0458 12/18/21 0500  BP:  118/63  Pulse:  61  Resp:  16  Temp:  98.2 F (36.8 C)  TempSrc:  Oral  SpO2:  100%  Weight: 73.5 kg   Height: 5\' 7"  (1.702 m)    Medications  albuterol (VENTOLIN HFA) 108 (90 Base) MCG/ACT inhaler 2 puff (has no administration in time range)  acetaminophen (TYLENOL) tablet 1,000 mg (1,000 mg Oral Given 12/18/21 0510)  ondansetron (ZOFRAN-ODT) disintegrating tablet 8 mg (8 mg Oral Given 12/18/21 0510)   The patient's COVID test is negative but her presentation is consistent with an acute viral illness.  It could represent early COVID with a false negative test.  Her chest x-ray does not show any infiltrate.  I do not believe any antibiotics or antivirals are indicated at this time.  She is not in a high risk group even if this is COVID.  Nausea improved with ODT Zofran.  We will provide patient an inhaler and AeroChamber for her shortness of breath.  PROCEDURES  Procedures   ED DIAGNOSES     ICD-10-CM   1. Viral respiratory illness  J98.8    B97.89          Shantice Menger, MD 12/18/21 [phone]

## 2021-12-18 NOTE — ED Triage Notes (Signed)
Pt states several people at her work have had covid  Pt states this morning about an hour ago she started feeling bad  Pt states she feels short of breath, sinuses hurt, her throat wants to hurt, nausea and just having fatigue

## 2023-02-01 ENCOUNTER — Other Ambulatory Visit: Payer: Self-pay

## 2023-02-01 ENCOUNTER — Emergency Department (HOSPITAL_BASED_OUTPATIENT_CLINIC_OR_DEPARTMENT_OTHER)
Admission: EM | Admit: 2023-02-01 | Discharge: 2023-02-02 | Disposition: A | Discharge status: Home / Self Care | Payer: Self-pay | Attending: Emergency Medicine | Admitting: Emergency Medicine

## 2023-02-01 ENCOUNTER — Emergency Department (HOSPITAL_BASED_OUTPATIENT_CLINIC_OR_DEPARTMENT_OTHER): Payer: Medicaid Other

## 2023-02-01 ENCOUNTER — Encounter (HOSPITAL_BASED_OUTPATIENT_CLINIC_OR_DEPARTMENT_OTHER): Payer: Self-pay

## 2023-02-01 DIAGNOSIS — I959 Hypotension, unspecified: Secondary | ICD-10-CM | POA: Insufficient documentation

## 2023-02-01 DIAGNOSIS — B349 Viral infection, unspecified: Secondary | ICD-10-CM | POA: Insufficient documentation

## 2023-02-01 DIAGNOSIS — D3502 Benign neoplasm of left adrenal gland: Secondary | ICD-10-CM | POA: Insufficient documentation

## 2023-02-01 DIAGNOSIS — Z20822 Contact with and (suspected) exposure to covid-19: Secondary | ICD-10-CM | POA: Insufficient documentation

## 2023-02-01 DIAGNOSIS — R55 Syncope and collapse: Secondary | ICD-10-CM

## 2023-02-01 LAB — CBC WITH DIFFERENTIAL/PLATELET
Abs Immature Granulocytes: 0.02 10*3/uL (ref 0.00–0.07)
Basophils Absolute: 0.1 10*3/uL (ref 0.0–0.1)
Basophils Relative: 1 %
Eosinophils Absolute: 0 10*3/uL (ref 0.0–0.5)
Eosinophils Relative: 1 %
HCT: 36.9 % (ref 36.0–46.0)
Hemoglobin: 12.5 g/dL (ref 12.0–15.0)
Immature Granulocytes: 0 %
Lymphocytes Relative: 36 %
Lymphs Abs: 2.1 10*3/uL (ref 0.7–4.0)
MCH: 31 pg (ref 26.0–34.0)
MCHC: 33.9 g/dL (ref 30.0–36.0)
MCV: 91.6 fL (ref 80.0–100.0)
Monocytes Absolute: 0.4 10*3/uL (ref 0.1–1.0)
Monocytes Relative: 6 %
Neutro Abs: 3.3 10*3/uL (ref 1.7–7.7)
Neutrophils Relative %: 56 %
Platelets: 196 10*3/uL (ref 150–400)
RBC: 4.03 MIL/uL (ref 3.87–5.11)
RDW: 11.9 % (ref 11.5–15.5)
WBC: 6 10*3/uL (ref 4.0–10.5)
nRBC: 0 % (ref 0.0–0.2)

## 2023-02-01 LAB — TROPONIN I (HIGH SENSITIVITY): Troponin I (High Sensitivity): <2 ng/L (ref <18)

## 2023-02-01 LAB — RESP PANEL BY RT-PCR (RSV, FLU A&B, COVID)  RVPGX2
Influenza A by PCR: NEGATIVE
Influenza B by PCR: NEGATIVE
Resp Syncytial Virus by PCR: NEGATIVE
SARS Coronavirus 2 by RT PCR: NEGATIVE

## 2023-02-01 LAB — HCG, SERUM, QUALITATIVE: Preg, Serum: NEGATIVE

## 2023-02-01 MED ORDER — LACTATED RINGERS IV BOLUS
1000.0000 mL | Freq: Once | INTRAVENOUS | Status: AC
  Administered 2023-02-01: 1000 mL via INTRAVENOUS

## 2023-02-01 MED ORDER — MORPHINE SULFATE (PF) 4 MG/ML IV SOLN
4.0000 mg | Freq: Once | INTRAVENOUS | Status: AC
  Administered 2023-02-01: 4 mg via INTRAVENOUS
  Filled 2023-02-01: qty 1

## 2023-02-01 MED ORDER — ALBUTEROL SULFATE HFA 108 (90 BASE) MCG/ACT IN AERS: 2.0000 | INHALATION_SPRAY | RESPIRATORY_TRACT | Status: DC | PRN

## 2023-02-01 MED ORDER — ONDANSETRON HCL 4 MG/2ML IJ SOLN
4.0000 mg | Freq: Once | INTRAMUSCULAR | Status: AC
  Administered 2023-02-01: 4 mg via INTRAVENOUS
  Filled 2023-02-01: qty 2

## 2023-02-01 NOTE — ED Provider Notes (Signed)
[place] [facility] Provider Note   CSN: [id number] Arrival date & time: 02/01/23  2153     History {Add pertinent medical, surgical, social history, OB history to HPI:1} Chief Complaint  Patient presents with   Shortness of Breath   Fatigue    Charlene Bryant is a 42 y.o. female.   Shortness of Breath 42 year old female previously healthy presenting for multiple concerns.  She states he was at work today when she developed some chills, then felt very hot, diaphoretic.  She was dizzy and felt somewhat short of breath.  She had a couple episodes of nausea and vomiting and has a mild abdominal pain.  She has had some chest pain as well although this is reproducible when she pushes on her chest.  No pleuritic pain.  No radiation of pain to her back  No fevers or cough.  No change in diet.  Coworker had similar symptoms with vomiting and fever like symptoms.  Patient is otherwise healthy, no history of DVT or PE or recent immobilization.  Mild upper abdominal pain.  Home Medications Prior to Admission medications   Medication Sig Start Date End Date Taking? Authorizing Provider  amphetamine-dextroamphetamine (ADDERALL) 30 MG tablet Take 1 tablet by mouth daily. 05/18/20   [provider]  buPROPion (WELLBUTRIN SR) 150 MG 12 hr tablet Take by mouth.    [provider]  ibuprofen (ADVIL,MOTRIN) 800 MG tablet Take 1 tablet (800 mg total) by mouth every 8 (eight) hours as needed. 04/14/17   Terrilee Files, MD  Loratadine (CLARITIN PO) Take by mouth.    [provider]  ondansetron (ZOFRAN-ODT) 8 MG disintegrating tablet Take 1 tablet (8 mg total) by mouth every 8 (eight) hours as needed for nausea or vomiting. 12/18/21   Molpus, John, MD  pantoprazole (PROTONIX) 20 MG tablet Take 1 tablet (20 mg total) by mouth daily. 04/01/20   Jeannie Fend, PA-C  ranitidine (ZANTAC) 75 MG tablet Take 75 mg by mouth 2 (two) times daily.     [provider]      Allergies    Amoxicillin    Review of Systems   Review of Systems  Respiratory:  Positive for shortness of breath.   Review of systems completed and notable as per HPI.  ROS otherwise negative.   Physical Exam Updated Vital Signs BP (!) 96/53   Pulse (!) 56   Temp 97.8 F (36.6 C) (Temporal)   Resp 17   Ht 5\' 7"  (1.702 m)   Wt 78.9 kg   SpO2 100%   BMI 27.25 kg/m  Physical Exam Vitals and nursing note reviewed.  Constitutional:      General: She is not in acute distress.    Appearance: She is well-developed.  HENT:     Head: Normocephalic and atraumatic.     Nose: Nose normal.     Mouth/Throat:     Mouth: Mucous membranes are moist.     Pharynx: Oropharynx is clear.  Eyes:     Extraocular Movements: Extraocular movements intact.     Conjunctiva/sclera: Conjunctivae normal.     Pupils: Pupils are equal, round, and reactive to light.  Cardiovascular:     Rate and Rhythm: Normal rate and regular rhythm.     Pulses: Normal pulses.     Heart sounds: Normal heart sounds. No murmur heard. Pulmonary:     Effort: Pulmonary effort is normal. No respiratory distress.     Breath sounds:  Normal breath sounds.  Abdominal:     Palpations: Abdomen is soft.     Tenderness: There is abdominal tenderness. There is no guarding or rebound.  Musculoskeletal:        General: No swelling.     Cervical back: Neck supple.     Right lower leg: No edema.     Left lower leg: No edema.  Skin:    General: Skin is warm and dry.     Capillary Refill: Capillary refill takes less than 2 seconds.  Neurological:     General: No focal deficit present.     Mental Status: She is alert and oriented to person, place, and time. Mental status is at baseline.  Psychiatric:        Mood and Affect: Mood normal.     ED Results / Procedures / Treatments   Labs (all labs ordered are listed, but only abnormal results are displayed) Labs Reviewed  RESP PANEL BY RT-PCR  (RSV, FLU A&B, COVID)  [id number]    EKG EKG Interpretation Date/Time:  Sunday February 01 2023 22:05:46 EDT Ventricular Rate:  56 PR Interval:  159 QRS Duration:  106 QT Interval:  455 QTC Calculation: 440 R Axis:   69  Text Interpretation: Sinus rhythm Baseline wander in lead(s) V3 Confirmed by Fulton Reek [id number]) on 02/01/2023 10:19:24 PM  Radiology No results found.  Procedures Procedures  {Document cardiac monitor, telemetry assessment procedure when appropriate:1}  Medications Ordered in ED Medications  albuterol (VENTOLIN HFA) 108 (90 Base) MCG/ACT inhaler 2 puff (has no administration in time range)    ED Course/ Medical Decision Making/ A&P   {   Click here for ABCD2, HEART and other calculatorsREFRESH Note before signing :1}                              Medical Decision Making Amount and/or Complexity of Data Reviewed Radiology: ordered.  Risk Prescription drug management.   Medical Decision Making:   Charlene Bryant is a 42 y.o. female who presented to the ED today with episode of diaphoresis, vomiting, chest pain, shortness of breath.  All signs reviewed notable for soft blood pressure.  EKG shows sinus rhythm, no acute ischemia or arrhythmia.  She had recent sick contact, wonder if her symptoms are more related to nausea and vomiting potentially from gastrointestinal illness.  She is some what tender on exam, obtain CT scan evaluate for possible obstruction, appendicitis, cholecystitis as well as lab workup.  Also give IV fluids.  She also notes some chest pain and shortness of breath.  Pain is somewhat reproducible on exam, does not radiate to her back or low sufficient for dissection.  Consider possible PE, will evaluate with CT scan.   {crccomplexity:27900} Reviewed and confirmed nursing documentation for past medical history, family history, social history.  Initial Study Results:   Laboratory  All laboratory results reviewed.  Labs notable for  ***  ***EKG EKG was reviewed independently. Rate, rhythm, axis, intervals all examined and without medically relevant abnormality. ST segments without concerns for elevations.    Radiology:  All images reviewed independently. ***Agree with radiology report at this time.      Consults: Case discussed with ***.   Reassessment and Plan:   ***    Patient's presentation is most consistent with {EM COPA:27473}     {Document critical care time when appropriate:1} {Document review of labs and clinical decision tools ie heart score,  Chads2Vasc2 etc:1}  {Document your independent review of radiology images, and any outside records:1} {Document your discussion with family members, caretakers, and with consultants:1} {Document social determinants of health affecting pt's care:1} {Document your decision making why or why not admission, treatments were needed:1} Final Clinical Impression(s) / ED Diagnoses Final diagnoses:  None    Rx / DC Orders ED Discharge Orders     None

## 2023-02-01 NOTE — ED Triage Notes (Signed)
Pt states that she started getting hot and sweaty while at work tonight. Pt reports two episodes of emesis and fatigue. Pt also complains of SHOB.

## 2023-02-01 NOTE — ED Provider Notes (Signed)
Care assumed at shift change. Here for an episode of dizziness, vomiting and weakness at work. Borderline low BP on arrival is improved. Awaiting labs and imaging. Patient requesting water to drink.  Physical Exam  BP 95/61   Pulse (!) 59   Temp 97.8 F (36.6 C) (Temporal)   Resp 16   Ht 5\' 7"  (1.702 m)   Wt 78.9 kg   SpO2 100%   BMI 27.25 kg/m   Physical Exam  Procedures  Procedures  ED Course / MDM    Medical Decision Making Amount and/or Complexity of Data Reviewed Labs: ordered. Radiology: ordered.  Risk Prescription drug management.   ***

## 2023-02-02 ENCOUNTER — Emergency Department (HOSPITAL_BASED_OUTPATIENT_CLINIC_OR_DEPARTMENT_OTHER): Payer: Medicaid Other

## 2023-02-02 ENCOUNTER — Encounter (HOSPITAL_BASED_OUTPATIENT_CLINIC_OR_DEPARTMENT_OTHER): Payer: Self-pay

## 2023-02-02 LAB — COMPREHENSIVE METABOLIC PANEL
ALT: 15 U/L (ref 0–44)
AST: 17 U/L (ref 15–41)
Albumin: 3.9 g/dL (ref 3.5–5.0)
Alkaline Phosphatase: 49 U/L (ref 38–126)
Anion gap: 10 (ref 5–15)
BUN: 13 mg/dL (ref 6–20)
CO2: 24 mmol/L (ref 22–32)
Calcium: 8.6 mg/dL — ABNORMAL LOW (ref 8.9–10.3)
Chloride: 101 mmol/L (ref 98–111)
Creatinine, Ser: 0.6 mg/dL (ref 0.44–1.00)
GFR, Estimated: >60 mL/min (ref >60)
Glucose, Bld: 148 mg/dL — ABNORMAL HIGH (ref 70–99)
Potassium: 3.6 mmol/L (ref 3.5–5.1)
Sodium: 135 mmol/L (ref 135–145)
Total Bilirubin: 0.6 mg/dL (ref 0.3–1.2)
Total Protein: 6.7 g/dL (ref 6.5–8.1)

## 2023-02-02 LAB — URINALYSIS, ROUTINE W REFLEX MICROSCOPIC
Bilirubin Urine: NEGATIVE
Glucose, UA: NEGATIVE mg/dL
Hgb urine dipstick: NEGATIVE
Ketones, ur: NEGATIVE mg/dL
Leukocytes,Ua: NEGATIVE
Nitrite: NEGATIVE
Protein, ur: NEGATIVE mg/dL
Specific Gravity, Urine: 1.01 (ref 1.005–1.030)
pH: 7 (ref 5.0–8.0)

## 2023-02-02 LAB — TROPONIN I (HIGH SENSITIVITY): Troponin I (High Sensitivity): <2 ng/L (ref <18)

## 2023-02-02 LAB — LIPASE, BLOOD: Lipase: 24 U/L (ref 11–51)

## 2023-02-02 MED ORDER — SODIUM CHLORIDE 0.9 % IV BOLUS
1000.0000 mL | Freq: Once | INTRAVENOUS | Status: AC
  Administered 2023-02-02: 1000 mL via INTRAVENOUS

## 2023-02-02 MED ORDER — IOHEXOL 350 MG/ML SOLN
80.0000 mL | Freq: Once | INTRAVENOUS | Status: AC | PRN
  Administered 2023-02-02: 80 mL via INTRAVENOUS
